# Patient Record
Sex: Male | Born: 1975 | Race: White | Hispanic: No | Marital: Married | Smoking: Never smoker
Health system: Southern US, Community
[De-identification: ages and names within clinical notes are randomized; demographics above are authoritative.]

## PROBLEM LIST (undated history)

## (undated) DIAGNOSIS — I10 Essential (primary) hypertension: Secondary | ICD-10-CM

## (undated) DIAGNOSIS — E785 Hyperlipidemia, unspecified: Secondary | ICD-10-CM

## (undated) HISTORY — PX: KNEE SURGERY: SHX244

---

## 2015-09-21 DIAGNOSIS — I10 Essential (primary) hypertension: Secondary | ICD-10-CM | POA: Diagnosis not present

## 2015-12-17 DIAGNOSIS — Z23 Encounter for immunization: Secondary | ICD-10-CM | POA: Diagnosis not present

## 2016-06-29 DIAGNOSIS — Z6839 Body mass index (BMI) 39.0-39.9, adult: Secondary | ICD-10-CM | POA: Diagnosis not present

## 2016-06-29 DIAGNOSIS — S96211S Strain of intrinsic muscle and tendon at ankle and foot level, right foot, sequela: Secondary | ICD-10-CM | POA: Diagnosis not present

## 2016-06-29 DIAGNOSIS — G4730 Sleep apnea, unspecified: Secondary | ICD-10-CM | POA: Diagnosis not present

## 2016-06-29 DIAGNOSIS — M2352 Chronic instability of knee, left knee: Secondary | ICD-10-CM | POA: Diagnosis not present

## 2016-06-29 DIAGNOSIS — Z111 Encounter for screening for respiratory tuberculosis: Secondary | ICD-10-CM | POA: Diagnosis not present

## 2016-06-29 DIAGNOSIS — Z23 Encounter for immunization: Secondary | ICD-10-CM | POA: Diagnosis not present

## 2016-09-26 DIAGNOSIS — Z0000 Encounter for general adult medical examination without abnormal findings: Secondary | ICD-10-CM | POA: Diagnosis not present

## 2016-09-26 DIAGNOSIS — Z125 Encounter for screening for malignant neoplasm of prostate: Secondary | ICD-10-CM | POA: Diagnosis not present

## 2016-09-26 DIAGNOSIS — Z13220 Encounter for screening for lipoid disorders: Secondary | ICD-10-CM | POA: Diagnosis not present

## 2016-10-03 DIAGNOSIS — Z0000 Encounter for general adult medical examination without abnormal findings: Secondary | ICD-10-CM | POA: Diagnosis not present

## 2016-10-03 DIAGNOSIS — Z6839 Body mass index (BMI) 39.0-39.9, adult: Secondary | ICD-10-CM | POA: Diagnosis not present

## 2016-10-03 DIAGNOSIS — G4730 Sleep apnea, unspecified: Secondary | ICD-10-CM | POA: Diagnosis not present

## 2016-10-03 DIAGNOSIS — J309 Allergic rhinitis, unspecified: Secondary | ICD-10-CM | POA: Diagnosis not present

## 2016-12-05 DIAGNOSIS — Z23 Encounter for immunization: Secondary | ICD-10-CM | POA: Diagnosis not present

## 2016-12-26 DIAGNOSIS — Z13220 Encounter for screening for lipoid disorders: Secondary | ICD-10-CM | POA: Diagnosis not present

## 2016-12-26 DIAGNOSIS — R945 Abnormal results of liver function studies: Secondary | ICD-10-CM | POA: Diagnosis not present

## 2017-01-09 DIAGNOSIS — Z6828 Body mass index (BMI) 28.0-28.9, adult: Secondary | ICD-10-CM | POA: Diagnosis not present

## 2017-01-09 DIAGNOSIS — R030 Elevated blood-pressure reading, without diagnosis of hypertension: Secondary | ICD-10-CM | POA: Diagnosis not present

## 2017-01-09 DIAGNOSIS — G4730 Sleep apnea, unspecified: Secondary | ICD-10-CM | POA: Diagnosis not present

## 2017-01-09 DIAGNOSIS — E785 Hyperlipidemia, unspecified: Secondary | ICD-10-CM | POA: Diagnosis not present

## 2017-09-15 ENCOUNTER — Ambulatory Visit
Admission: RE | Admit: 2017-09-15 | Discharge: 2017-09-15 | Disposition: A | Discharge status: Home / Self Care | Payer: BLUE CROSS/BLUE SHIELD | Source: Ambulatory Visit | Attending: Family Medicine | Admitting: Family Medicine

## 2017-09-15 ENCOUNTER — Other Ambulatory Visit: Payer: Self-pay | Admitting: Family Medicine

## 2017-09-15 DIAGNOSIS — W57XXXA Bitten or stung by nonvenomous insect and other nonvenomous arthropods, initial encounter: Secondary | ICD-10-CM | POA: Diagnosis not present

## 2017-09-15 DIAGNOSIS — G8929 Other chronic pain: Secondary | ICD-10-CM

## 2017-09-15 DIAGNOSIS — Z6828 Body mass index (BMI) 28.0-28.9, adult: Secondary | ICD-10-CM | POA: Diagnosis not present

## 2017-09-15 DIAGNOSIS — M542 Cervicalgia: Secondary | ICD-10-CM | POA: Diagnosis not present

## 2017-10-02 DIAGNOSIS — Z6828 Body mass index (BMI) 28.0-28.9, adult: Secondary | ICD-10-CM | POA: Diagnosis not present

## 2017-10-02 DIAGNOSIS — M542 Cervicalgia: Secondary | ICD-10-CM | POA: Diagnosis not present

## 2017-10-02 DIAGNOSIS — J309 Allergic rhinitis, unspecified: Secondary | ICD-10-CM | POA: Diagnosis not present

## 2017-10-02 DIAGNOSIS — G4730 Sleep apnea, unspecified: Secondary | ICD-10-CM | POA: Diagnosis not present

## 2017-10-09 DIAGNOSIS — Z125 Encounter for screening for malignant neoplasm of prostate: Secondary | ICD-10-CM | POA: Diagnosis not present

## 2017-10-09 DIAGNOSIS — Z1329 Encounter for screening for other suspected endocrine disorder: Secondary | ICD-10-CM | POA: Diagnosis not present

## 2017-10-09 DIAGNOSIS — E785 Hyperlipidemia, unspecified: Secondary | ICD-10-CM | POA: Diagnosis not present

## 2017-10-09 DIAGNOSIS — Z114 Encounter for screening for human immunodeficiency virus [HIV]: Secondary | ICD-10-CM | POA: Diagnosis not present

## 2017-10-09 DIAGNOSIS — Z0000 Encounter for general adult medical examination without abnormal findings: Secondary | ICD-10-CM | POA: Diagnosis not present

## 2017-10-20 DIAGNOSIS — R030 Elevated blood-pressure reading, without diagnosis of hypertension: Secondary | ICD-10-CM | POA: Diagnosis not present

## 2017-10-20 DIAGNOSIS — Z6828 Body mass index (BMI) 28.0-28.9, adult: Secondary | ICD-10-CM | POA: Diagnosis not present

## 2017-10-20 DIAGNOSIS — Z0000 Encounter for general adult medical examination without abnormal findings: Secondary | ICD-10-CM | POA: Diagnosis not present

## 2017-10-20 DIAGNOSIS — E781 Pure hyperglyceridemia: Secondary | ICD-10-CM | POA: Diagnosis not present

## 2018-01-23 DIAGNOSIS — E785 Hyperlipidemia, unspecified: Secondary | ICD-10-CM | POA: Diagnosis not present

## 2018-01-23 DIAGNOSIS — Z6828 Body mass index (BMI) 28.0-28.9, adult: Secondary | ICD-10-CM | POA: Diagnosis not present

## 2018-01-23 DIAGNOSIS — I10 Essential (primary) hypertension: Secondary | ICD-10-CM | POA: Diagnosis not present

## 2018-01-23 DIAGNOSIS — E782 Mixed hyperlipidemia: Secondary | ICD-10-CM | POA: Diagnosis not present

## 2018-06-13 DIAGNOSIS — Z23 Encounter for immunization: Secondary | ICD-10-CM | POA: Diagnosis not present

## 2018-10-11 ENCOUNTER — Other Ambulatory Visit: Payer: Self-pay

## 2018-10-11 DIAGNOSIS — R51 Headache: Secondary | ICD-10-CM | POA: Diagnosis not present

## 2018-10-11 DIAGNOSIS — U071 COVID-19: Secondary | ICD-10-CM | POA: Diagnosis not present

## 2018-10-11 DIAGNOSIS — Z20822 Contact with and (suspected) exposure to covid-19: Secondary | ICD-10-CM

## 2018-10-11 DIAGNOSIS — R0981 Nasal congestion: Secondary | ICD-10-CM | POA: Diagnosis not present

## 2018-10-13 LAB — NOVEL CORONAVIRUS, NAA: SARS-CoV-2, NAA: NOT DETECTED

## 2018-10-15 DIAGNOSIS — R0981 Nasal congestion: Secondary | ICD-10-CM | POA: Diagnosis not present

## 2018-10-15 DIAGNOSIS — J309 Allergic rhinitis, unspecified: Secondary | ICD-10-CM | POA: Diagnosis not present

## 2018-10-15 DIAGNOSIS — U071 COVID-19: Secondary | ICD-10-CM | POA: Diagnosis not present

## 2018-10-15 DIAGNOSIS — R51 Headache: Secondary | ICD-10-CM | POA: Diagnosis not present

## 2018-11-12 DIAGNOSIS — J309 Allergic rhinitis, unspecified: Secondary | ICD-10-CM | POA: Diagnosis not present

## 2018-11-12 DIAGNOSIS — I10 Essential (primary) hypertension: Secondary | ICD-10-CM | POA: Diagnosis not present

## 2018-11-12 DIAGNOSIS — G8929 Other chronic pain: Secondary | ICD-10-CM | POA: Diagnosis not present

## 2018-11-12 DIAGNOSIS — M542 Cervicalgia: Secondary | ICD-10-CM | POA: Diagnosis not present

## 2018-11-22 DIAGNOSIS — M5412 Radiculopathy, cervical region: Secondary | ICD-10-CM | POA: Diagnosis not present

## 2018-12-04 DIAGNOSIS — Z23 Encounter for immunization: Secondary | ICD-10-CM | POA: Diagnosis not present

## 2018-12-10 DIAGNOSIS — M542 Cervicalgia: Secondary | ICD-10-CM | POA: Diagnosis not present

## 2018-12-10 DIAGNOSIS — I10 Essential (primary) hypertension: Secondary | ICD-10-CM | POA: Diagnosis not present

## 2018-12-10 DIAGNOSIS — E782 Mixed hyperlipidemia: Secondary | ICD-10-CM | POA: Diagnosis not present

## 2018-12-10 DIAGNOSIS — L309 Dermatitis, unspecified: Secondary | ICD-10-CM | POA: Diagnosis not present

## 2018-12-31 DIAGNOSIS — Z125 Encounter for screening for malignant neoplasm of prostate: Secondary | ICD-10-CM | POA: Diagnosis not present

## 2018-12-31 DIAGNOSIS — R945 Abnormal results of liver function studies: Secondary | ICD-10-CM | POA: Diagnosis not present

## 2018-12-31 DIAGNOSIS — Z0000 Encounter for general adult medical examination without abnormal findings: Secondary | ICD-10-CM | POA: Diagnosis not present

## 2019-01-07 DIAGNOSIS — J309 Allergic rhinitis, unspecified: Secondary | ICD-10-CM | POA: Diagnosis not present

## 2019-01-07 DIAGNOSIS — Z6828 Body mass index (BMI) 28.0-28.9, adult: Secondary | ICD-10-CM | POA: Diagnosis not present

## 2019-01-07 DIAGNOSIS — E782 Mixed hyperlipidemia: Secondary | ICD-10-CM | POA: Diagnosis not present

## 2019-01-07 DIAGNOSIS — I10 Essential (primary) hypertension: Secondary | ICD-10-CM | POA: Diagnosis not present

## 2019-01-20 ENCOUNTER — Other Ambulatory Visit: Payer: Self-pay

## 2019-01-20 ENCOUNTER — Emergency Department (HOSPITAL_COMMUNITY)
Admission: EM | Admit: 2019-01-20 | Discharge: 2019-01-20 | Disposition: A | Discharge status: Home / Self Care | Payer: BC Managed Care – PPO | Attending: Emergency Medicine | Admitting: Emergency Medicine

## 2019-01-20 ENCOUNTER — Emergency Department (HOSPITAL_COMMUNITY): Payer: BC Managed Care – PPO

## 2019-01-20 DIAGNOSIS — R509 Fever, unspecified: Secondary | ICD-10-CM | POA: Diagnosis not present

## 2019-01-20 DIAGNOSIS — Z20822 Contact with and (suspected) exposure to covid-19: Secondary | ICD-10-CM

## 2019-01-20 DIAGNOSIS — U071 COVID-19: Secondary | ICD-10-CM | POA: Diagnosis not present

## 2019-01-20 DIAGNOSIS — R05 Cough: Secondary | ICD-10-CM | POA: Insufficient documentation

## 2019-01-20 DIAGNOSIS — R0602 Shortness of breath: Secondary | ICD-10-CM | POA: Diagnosis not present

## 2019-01-20 MED ORDER — DEXAMETHASONE SODIUM PHOSPHATE 10 MG/ML IJ SOLN
10.0000 mg | Freq: Once | INTRAMUSCULAR | Status: AC
  Administered 2019-01-20: 10 mg via INTRAMUSCULAR
  Filled 2019-01-20: qty 1

## 2019-01-20 MED ORDER — IBUPROFEN 800 MG PO TABS
800.0000 mg | ORAL_TABLET | Freq: Once | ORAL | Status: AC
  Administered 2019-01-20: 800 mg via ORAL
  Filled 2019-01-20: qty 1

## 2019-01-20 MED ORDER — IBUPROFEN 600 MG PO TABS: 600.0000 mg | ORAL_TABLET | Freq: Four times a day (QID) | ORAL | 0 refills | Status: DC | PRN

## 2019-01-20 MED ORDER — ACETAMINOPHEN 500 MG PO TABS
1000.0000 mg | ORAL_TABLET | Freq: Once | ORAL | Status: AC
  Administered 2019-01-20: 1000 mg via ORAL
  Filled 2019-01-20: qty 2

## 2019-01-20 MED ORDER — PREDNISONE 10 MG (21) PO TBPK: ORAL_TABLET | ORAL | 0 refills | Status: DC

## 2019-01-20 NOTE — ED Triage Notes (Signed)
Pt c/o SOB, cough, and headache that started yesterday. Pt c/o fever that started today. Pt 96% RA at this time.

## 2019-01-20 NOTE — ED Provider Notes (Signed)
[name]-EMERGENCY DEPT Provider Note   CSN: [id number] Arrival date & time: 01/20/19  2038     History   Chief Complaint Chief Complaint  Patient presents with  . Shortness of Breath  . Cough  . Fever  . Headache    HPI Robert Davis is a 43 y.o. male.     Pt presents to the ED today with sob, cough, and fever.  The pt said sx started yesterday.  Pt just attended an orthopedic equipment convention in [place].  He said the convention was in a hotel and sessions were held in small groups.  He said he was wearing a mask during the convention.  No known Covid exposures.  The pt has not taken any tylenol or ibuprofen for his fever.  He had an inhaler which he tried and did not help.       No past medical history on file.  There are no active problems to display for this patient.  HTN High cholesterol    Home Medications    Prior to Admission medications   Medication Sig Start Date End Date Taking? Authorizing Provider  ibuprofen (ADVIL) 600 MG tablet Take 1 tablet (600 mg total) by mouth every 6 (six) hours as needed. 01/20/19   Jacalyn Lefevre, MD  predniSONE (STERAPRED UNI-PAK 21 TAB) 10 MG (21) TBPK tablet Take 6 tabs for 2 days, then 5 for 2 days, then 4 for 2 days, then 3 for 2 days, 2 for 2 days, then 1 for 2 days 01/20/19   Jacalyn Lefevre, MD   Losartan   Family History No family history on file.  Social History Social History   Tobacco Use  . Smoking status: Not on file  Substance Use Topics  . Alcohol use: Not on file  . Drug use: Not on file   No tob  Allergies   Codeine   Review of Systems Review of Systems  Constitutional: Positive for fatigue and fever.  Respiratory: Positive for cough and shortness of breath.   Musculoskeletal: Positive for myalgias.  All other systems reviewed and are negative.    Physical Exam Updated Vital Signs BP (!) 149/83   Pulse (!) 103   Temp (!) 100.9 F (38.3 C) (Oral)   Resp (!) 26    Ht 5\' 11"  (1.803 m)   Wt 95.3 kg   SpO2 95%   BMI 29.29 kg/m   Physical Exam Vitals signs and nursing note reviewed.  Constitutional:      Appearance: He is well-developed.  HENT:     Head: Normocephalic and atraumatic.     Mouth/Throat:     Mouth: Mucous membranes are moist.     Pharynx: Oropharynx is clear.  Eyes:     Extraocular Movements: Extraocular movements intact.     Pupils: Pupils are equal, round, and reactive to light.  Neck:     Musculoskeletal: Normal range of motion and neck supple.  Cardiovascular:     Rate and Rhythm: Regular rhythm. Tachycardia present.  Pulmonary:     Effort: Pulmonary effort is normal.     Breath sounds: Normal breath sounds.  Abdominal:     General: Bowel sounds are normal.     Palpations: Abdomen is soft.  Musculoskeletal: Normal range of motion.  Skin:    General: Skin is warm.     Capillary Refill: Capillary refill takes less than 2 seconds.  Neurological:     General: No focal deficit present.  Mental Status: He is alert and oriented to person, place, and time.  Psychiatric:        Mood and Affect: Mood normal.        Behavior: Behavior normal.      ED Treatments / Results  Labs (all labs ordered are listed, but only abnormal results are displayed) Labs Reviewed  SARS CORONAVIRUS 2 (TAT 6-24 HRS)    EKG None  Radiology Dg Chest Portable 1 View  Result Date: 01/20/2019 CLINICAL DATA:  Shortness of breath and fever EXAM: PORTABLE CHEST 1 VIEW COMPARISON:  None. FINDINGS: The heart size and mediastinal contours are within normal limits. Both lungs are clear. The visualized skeletal structures are unremarkable. IMPRESSION: No active disease. Electronically Signed   By: Ulyses Jarred M.D.   On: 01/20/2019 22:42    Procedures Procedures (including critical care time)  Medications Ordered in ED Medications  acetaminophen (TYLENOL) tablet 1,000 mg (1,000 mg Oral Given 01/20/19 2221)  dexamethasone (DECADRON)  injection 10 mg (10 mg Intramuscular Given 01/20/19 2222)  ibuprofen (ADVIL) tablet 800 mg (800 mg Oral Given 01/20/19 2221)     Initial Impression / Assessment and Plan / ED Course  I have reviewed the triage vital signs and the nursing notes.  Pertinent labs & imaging results that were available during my care of the patient were reviewed by me and considered in my medical decision making (see chart for details).        Pt has no pna on CXR.  He is oxygenating well.  His fever was treated with tylenol and ibuprofen.  I suspect pt has covid.  His swab is pending at d/c.  He knows to stay isolated.  Robert Davis was evaluated in Emergency Department on 01/20/2019 for the symptoms described in the history of present illness. He was evaluated in the context of the global COVID-19 pandemic, which necessitated consideration that the patient might be at risk for infection with the SARS-CoV-2 virus that causes COVID-19. Institutional protocols and algorithms that pertain to the evaluation of patients at risk for COVID-19 are in a state of rapid change based on information released by regulatory bodies including the CDC and federal and state organizations. These policies and algorithms were followed during the patient's care in the ED.  Final Clinical Impressions(s) / ED Diagnoses   Final diagnoses:  Suspected COVID-19 virus infection    ED Discharge Orders         Ordered    predniSONE (STERAPRED UNI-PAK 21 TAB) 10 MG (21) TBPK tablet     01/20/19 2310    ibuprofen (ADVIL) 600 MG tablet  Every 6 hours PRN     01/20/19 2310           Isla Pence, MD 01/20/19 2311

## 2019-01-20 NOTE — ED Notes (Addendum)
Pt ambulatory to room without concern

## 2019-01-21 LAB — SARS CORONAVIRUS 2 (TAT 6-24 HRS): SARS Coronavirus 2: POSITIVE — AB

## 2019-01-22 DIAGNOSIS — R509 Fever, unspecified: Secondary | ICD-10-CM | POA: Diagnosis not present

## 2019-01-22 DIAGNOSIS — U071 COVID-19: Secondary | ICD-10-CM | POA: Diagnosis not present

## 2019-01-22 DIAGNOSIS — R05 Cough: Secondary | ICD-10-CM | POA: Diagnosis not present

## 2019-01-29 DIAGNOSIS — U071 COVID-19: Secondary | ICD-10-CM | POA: Diagnosis not present

## 2019-01-29 DIAGNOSIS — J329 Chronic sinusitis, unspecified: Secondary | ICD-10-CM | POA: Diagnosis not present

## 2019-01-29 DIAGNOSIS — R432 Parageusia: Secondary | ICD-10-CM | POA: Diagnosis not present

## 2019-01-30 ENCOUNTER — Other Ambulatory Visit: Payer: Self-pay

## 2019-01-30 ENCOUNTER — Encounter (HOSPITAL_COMMUNITY): Payer: Self-pay | Admitting: Internal Medicine

## 2019-01-30 ENCOUNTER — Emergency Department (HOSPITAL_COMMUNITY): Payer: BC Managed Care – PPO

## 2019-01-30 ENCOUNTER — Inpatient Hospital Stay (HOSPITAL_COMMUNITY)
Admission: EM | Admit: 2019-01-30 | Discharge: 2019-02-05 | DRG: 871 | Disposition: A | Discharge status: Home / Self Care | Payer: BC Managed Care – PPO | Attending: Family Medicine | Admitting: Family Medicine

## 2019-01-30 DIAGNOSIS — R112 Nausea with vomiting, unspecified: Secondary | ICD-10-CM | POA: Diagnosis not present

## 2019-01-30 DIAGNOSIS — J9600 Acute respiratory failure, unspecified whether with hypoxia or hypercapnia: Secondary | ICD-10-CM | POA: Diagnosis not present

## 2019-01-30 DIAGNOSIS — Z833 Family history of diabetes mellitus: Secondary | ICD-10-CM

## 2019-01-30 DIAGNOSIS — E785 Hyperlipidemia, unspecified: Secondary | ICD-10-CM | POA: Diagnosis not present

## 2019-01-30 DIAGNOSIS — Z885 Allergy status to narcotic agent status: Secondary | ICD-10-CM

## 2019-01-30 DIAGNOSIS — E860 Dehydration: Secondary | ICD-10-CM | POA: Diagnosis present

## 2019-01-30 DIAGNOSIS — E861 Hypovolemia: Secondary | ICD-10-CM | POA: Diagnosis present

## 2019-01-30 DIAGNOSIS — E7800 Pure hypercholesterolemia, unspecified: Secondary | ICD-10-CM | POA: Diagnosis not present

## 2019-01-30 DIAGNOSIS — Z8249 Family history of ischemic heart disease and other diseases of the circulatory system: Secondary | ICD-10-CM

## 2019-01-30 DIAGNOSIS — A4189 Other specified sepsis: Secondary | ICD-10-CM | POA: Diagnosis not present

## 2019-01-30 DIAGNOSIS — U071 COVID-19: Secondary | ICD-10-CM | POA: Diagnosis not present

## 2019-01-30 DIAGNOSIS — Z79899 Other long term (current) drug therapy: Secondary | ICD-10-CM | POA: Diagnosis not present

## 2019-01-30 DIAGNOSIS — R05 Cough: Secondary | ICD-10-CM | POA: Diagnosis not present

## 2019-01-30 DIAGNOSIS — Z8349 Family history of other endocrine, nutritional and metabolic diseases: Secondary | ICD-10-CM | POA: Diagnosis not present

## 2019-01-30 DIAGNOSIS — E871 Hypo-osmolality and hyponatremia: Secondary | ICD-10-CM | POA: Diagnosis present

## 2019-01-30 DIAGNOSIS — R197 Diarrhea, unspecified: Secondary | ICD-10-CM | POA: Diagnosis not present

## 2019-01-30 DIAGNOSIS — R7989 Other specified abnormal findings of blood chemistry: Secondary | ICD-10-CM | POA: Diagnosis not present

## 2019-01-30 DIAGNOSIS — I10 Essential (primary) hypertension: Secondary | ICD-10-CM | POA: Diagnosis present

## 2019-01-30 DIAGNOSIS — R509 Fever, unspecified: Secondary | ICD-10-CM | POA: Diagnosis not present

## 2019-01-30 DIAGNOSIS — J1289 Other viral pneumonia: Secondary | ICD-10-CM | POA: Diagnosis not present

## 2019-01-30 DIAGNOSIS — J069 Acute upper respiratory infection, unspecified: Secondary | ICD-10-CM | POA: Diagnosis not present

## 2019-01-30 DIAGNOSIS — J9601 Acute respiratory failure with hypoxia: Secondary | ICD-10-CM | POA: Diagnosis present

## 2019-01-30 DIAGNOSIS — A0839 Other viral enteritis: Secondary | ICD-10-CM | POA: Diagnosis present

## 2019-01-30 DIAGNOSIS — A084 Viral intestinal infection, unspecified: Secondary | ICD-10-CM

## 2019-01-30 DIAGNOSIS — J1282 Pneumonia due to coronavirus disease 2019: Secondary | ICD-10-CM | POA: Diagnosis present

## 2019-01-30 HISTORY — DX: Essential (primary) hypertension: I10

## 2019-01-30 HISTORY — DX: Hyperlipidemia, unspecified: E78.5

## 2019-01-30 LAB — PROCALCITONIN: Procalcitonin: <0.1 ng/mL

## 2019-01-30 LAB — COMPREHENSIVE METABOLIC PANEL
ALT: 39 U/L (ref 0–44)
AST: 39 U/L (ref 15–41)
Albumin: 3.4 g/dL — ABNORMAL LOW (ref 3.5–5.0)
Alkaline Phosphatase: 39 U/L (ref 38–126)
Anion gap: 11 (ref 5–15)
BUN: 15 mg/dL (ref 6–20)
CO2: 23 mmol/L (ref 22–32)
Calcium: 8.2 mg/dL — ABNORMAL LOW (ref 8.9–10.3)
Chloride: 97 mmol/L — ABNORMAL LOW (ref 98–111)
Creatinine, Ser: 0.98 mg/dL (ref 0.61–1.24)
GFR calc Af Amer: >60 mL/min (ref >60)
GFR calc non Af Amer: >60 mL/min (ref >60)
Glucose, Bld: 107 mg/dL — ABNORMAL HIGH (ref 70–99)
Potassium: 3.9 mmol/L (ref 3.5–5.1)
Sodium: 131 mmol/L — ABNORMAL LOW (ref 135–145)
Total Bilirubin: 0.9 mg/dL (ref 0.3–1.2)
Total Protein: 7 g/dL (ref 6.5–8.1)

## 2019-01-30 LAB — LACTIC ACID, PLASMA: Lactic Acid, Venous: 1 mmol/L (ref 0.5–1.9)

## 2019-01-30 LAB — CBC WITH DIFFERENTIAL/PLATELET
Abs Immature Granulocytes: 0.08 10*3/uL — ABNORMAL HIGH (ref 0.00–0.07)
Basophils Absolute: 0 10*3/uL (ref 0.0–0.1)
Basophils Relative: 0 %
Eosinophils Absolute: 0 10*3/uL (ref 0.0–0.5)
Eosinophils Relative: 0 %
HCT: 42.8 % (ref 39.0–52.0)
Hemoglobin: 14.6 g/dL (ref 13.0–17.0)
Immature Granulocytes: 1 %
Lymphocytes Relative: 8 %
Lymphs Abs: 0.7 10*3/uL (ref 0.7–4.0)
MCH: 30.9 pg (ref 26.0–34.0)
MCHC: 34.1 g/dL (ref 30.0–36.0)
MCV: 90.5 fL (ref 80.0–100.0)
Monocytes Absolute: 0.5 10*3/uL (ref 0.1–1.0)
Monocytes Relative: 6 %
Neutro Abs: 7.6 10*3/uL (ref 1.7–7.7)
Neutrophils Relative %: 85 %
Platelets: 156 10*3/uL (ref 150–400)
RBC: 4.73 MIL/uL (ref 4.22–5.81)
RDW: 12.9 % (ref 11.5–15.5)
WBC: 8.9 10*3/uL (ref 4.0–10.5)
nRBC: 0 % (ref 0.0–0.2)

## 2019-01-30 LAB — FERRITIN: Ferritin: 1389 ng/mL — ABNORMAL HIGH (ref 24–336)

## 2019-01-30 LAB — LACTATE DEHYDROGENASE
LDH: 229 U/L — ABNORMAL HIGH (ref 98–192)
LDH: 236 U/L — ABNORMAL HIGH (ref 98–192)

## 2019-01-30 LAB — C-REACTIVE PROTEIN: CRP: 9 mg/dL — ABNORMAL HIGH (ref <1.0)

## 2019-01-30 LAB — D-DIMER, QUANTITATIVE: D-Dimer, Quant: 0.86 ug/mL-FEU — ABNORMAL HIGH (ref 0.00–0.50)

## 2019-01-30 LAB — FIBRINOGEN: Fibrinogen: 639 mg/dL — ABNORMAL HIGH (ref 210–475)

## 2019-01-30 LAB — TRIGLYCERIDES: Triglycerides: 88 mg/dL (ref <150)

## 2019-01-30 MED ORDER — SODIUM CHLORIDE 0.9 % IV BOLUS
500.0000 mL | Freq: Once | INTRAVENOUS | Status: AC
  Administered 2019-01-30: 500 mL via INTRAVENOUS

## 2019-01-30 MED ORDER — ENOXAPARIN SODIUM 40 MG/0.4ML ~~LOC~~ SOLN
40.0000 mg | SUBCUTANEOUS | Status: DC
  Administered 2019-01-30 – 2019-02-04 (×6): 40 mg via SUBCUTANEOUS
  Filled 2019-01-30 (×6): qty 0.4

## 2019-01-30 MED ORDER — ONDANSETRON HCL 4 MG/2ML IJ SOLN
4.0000 mg | Freq: Once | INTRAMUSCULAR | Status: AC
  Administered 2019-01-30: 4 mg via INTRAVENOUS
  Filled 2019-01-30: qty 2

## 2019-01-30 MED ORDER — SODIUM CHLORIDE 0.9 % IV SOLN
100.0000 mg | INTRAVENOUS | Status: AC
  Administered 2019-01-31 – 2019-02-03 (×4): 100 mg via INTRAVENOUS
  Filled 2019-01-30 (×4): qty 20

## 2019-01-30 MED ORDER — ONDANSETRON HCL 4 MG/2ML IJ SOLN: 4.0000 mg | Freq: Four times a day (QID) | INTRAMUSCULAR | Status: DC | PRN

## 2019-01-30 MED ORDER — ROSUVASTATIN CALCIUM 5 MG PO TABS
5.0000 mg | ORAL_TABLET | Freq: Every day | ORAL | Status: DC
  Administered 2019-01-30 – 2019-02-05 (×7): 5 mg via ORAL
  Filled 2019-01-30 (×7): qty 1

## 2019-01-30 MED ORDER — DEXAMETHASONE SODIUM PHOSPHATE 10 MG/ML IJ SOLN
6.0000 mg | INTRAMUSCULAR | Status: DC
  Administered 2019-01-30 – 2019-02-01 (×3): 6 mg via INTRAVENOUS
  Filled 2019-01-30 (×3): qty 1

## 2019-01-30 MED ORDER — ZINC SULFATE 220 (50 ZN) MG PO CAPS
220.0000 mg | ORAL_CAPSULE | Freq: Every day | ORAL | Status: DC
  Administered 2019-01-30 – 2019-02-05 (×7): 220 mg via ORAL
  Filled 2019-01-30 (×7): qty 1

## 2019-01-30 MED ORDER — GUAIFENESIN-DM 100-10 MG/5ML PO SYRP
10.0000 mL | ORAL_SOLUTION | ORAL | Status: DC | PRN
  Administered 2019-02-02 – 2019-02-03 (×3): 10 mL via ORAL
  Filled 2019-01-30 (×3): qty 10

## 2019-01-30 MED ORDER — ACETAMINOPHEN 325 MG PO TABS
650.0000 mg | ORAL_TABLET | Freq: Once | ORAL | Status: AC
  Administered 2019-01-30: 650 mg via ORAL
  Filled 2019-01-30: qty 2

## 2019-01-30 MED ORDER — VITAMIN C 500 MG PO TABS
500.0000 mg | ORAL_TABLET | Freq: Every day | ORAL | Status: DC
  Administered 2019-01-30 – 2019-02-05 (×7): 500 mg via ORAL
  Filled 2019-01-30 (×7): qty 1

## 2019-01-30 MED ORDER — LORATADINE 10 MG PO TABS
10.0000 mg | ORAL_TABLET | Freq: Every day | ORAL | Status: DC
  Administered 2019-01-30 – 2019-02-05 (×7): 10 mg via ORAL
  Filled 2019-01-30 (×7): qty 1

## 2019-01-30 MED ORDER — ACETAMINOPHEN 325 MG PO TABS: 650.0000 mg | ORAL_TABLET | Freq: Four times a day (QID) | ORAL | Status: DC | PRN

## 2019-01-30 MED ORDER — SODIUM CHLORIDE 0.9 % IV SOLN
200.0000 mg | Freq: Once | INTRAVENOUS | Status: AC
  Administered 2019-01-30: 200 mg via INTRAVENOUS
  Filled 2019-01-30: qty 40

## 2019-01-30 MED ORDER — LOPERAMIDE HCL 2 MG PO CAPS: 2.0000 mg | ORAL_CAPSULE | ORAL | Status: DC | PRN

## 2019-01-30 NOTE — ED Notes (Signed)
Per PCP, states recently diagnosed with Covid-states vomiting, fever and possible dehydration-will probably need admission

## 2019-01-30 NOTE — Progress Notes (Signed)
Pharmacy: Remdesivir   Patient is a 43 y.o. male with COVID.  Pharmacy has been consulted for remdesivir dosing.   - CXR shows "New consolidation in the left mid lung compatible with active pneumonia." -Pt requiring supplemental oxygen (2L [place]) -ALT 39    A/P:  - Patient meets criteria for remdesivir. Will initiate remdesivir 200 mg once followed by 100 mg daily x 4 days.  - Daily CMET while on remdesivir - Will f/u pt's ALT and clinical condition

## 2019-01-30 NOTE — ED Provider Notes (Signed)
[facility] DEPT Provider Note   CSN: [id number] Arrival date & time: 01/30/19  1722     History   Chief Complaint No chief complaint on file.   HPI Robert Davis is a 43 y.o. male with history of known COVID-19 infection presenting today for evaluation of acute onset, progressively worsening fevers, nausea, vomiting, diarrhea since last night.  He was seen and evaluated at [facility] ED on 01/20/2019 after attending an orthopedic equipment convention in [place] with shortness of breath, cough, fever.  Was found to be stable for discharge home at that time but tested positive for Covid.  He states that he has had low-grade fevers at home, has been taking prednisone but was told to discontinue this by his PCP yesterday.  Yesterday he developed a fever of 102 F, took some ibuprofen and shortly thereafter developed nausea and vomiting.  Reports nausea is persistent, has not been able to tolerate p.o. since yesterday.  Multiple episodes of nonbloody nonbilious emesis and watery nonbloody diarrhea.  Notes dyspnea on exertion, cough productive of bloody sputum since yesterday.  Denies chest pain, no leg swelling, no prior history of DVT or PE.     The history is provided by the patient.    No past medical history on file.  Patient Active Problem List   Diagnosis Date Noted  . Acute respiratory failure with hypoxia ([place]) 01/30/2019  . Pneumonia due to COVID-19 virus 01/30/2019      Home Medications    Prior to Admission medications   Medication Sig Start Date End Date Taking? Authorizing Provider  amoxicillin-clavulanate (AUGMENTIN) 875-125 MG tablet Take 1 tablet by mouth 2 (two) times daily. 01/29/19  Yes [provider]  ibuprofen (ADVIL) 600 MG tablet Take 1 tablet (600 mg total) by mouth every 6 (six) hours as needed. 01/20/19  Yes Isla Pence, MD  loratadine (CLARITIN) 10 MG tablet Take 10 mg by mouth daily.   Yes [provider]  losartan (COZAAR) 25 MG tablet Take 25 mg by mouth daily. 12/10/18  Yes [provider]  rosuvastatin (CRESTOR) 5 MG tablet Take 5 mg by mouth daily. 01/07/19  Yes [provider]  predniSONE (STERAPRED UNI-PAK 21 TAB) 10 MG (21) TBPK tablet Take 6 tabs for 2 days, then 5 for 2 days, then 4 for 2 days, then 3 for 2 days, 2 for 2 days, then 1 for 2 days Patient not taking: Reported on 01/30/2019 01/20/19   Isla Pence, MD    Family History No family history on file.  Social History Social History   Tobacco Use  . Smoking status: Not on file  Substance Use Topics  . Alcohol use: Not on file  . Drug use: Not on file     Allergies   Codeine   Review of Systems Review of Systems  Constitutional: Positive for chills, fatigue and fever.  Respiratory: Positive for cough and shortness of breath.   Cardiovascular: Negative for chest pain and leg swelling.  Gastrointestinal: Positive for diarrhea, nausea and vomiting. Negative for abdominal pain.  Musculoskeletal: Positive for myalgias.  All other systems reviewed and are negative.    Physical Exam Updated Vital Signs BP 133/86 (BP Location: Left Arm)   Pulse 89   Temp 100.3 F (37.9 C) (Oral)   Resp 20   Ht 5\' 11"  (1.803 m)   Wt 90.7 kg   SpO2 96%   BMI 27.89 kg/m   Physical Exam Vitals signs and  nursing note reviewed.  Constitutional:      General: He is not in acute distress.    Appearance: He is well-developed.  HENT:     Head: Normocephalic and atraumatic.  Eyes:     General:        Right eye: No discharge.        Left eye: No discharge.     Conjunctiva/sclera: Conjunctivae normal.  Neck:     Musculoskeletal: Normal range of motion and neck supple. No neck rigidity or muscular tenderness.     Vascular: No JVD.     Trachea: No tracheal deviation.  Cardiovascular:     Rate and Rhythm: Tachycardia present.     Pulses: Normal pulses.  Pulmonary:     Comments: Tachypneic, speaking in  full sentences, SPO2 saturations 93% on room air Abdominal:     General: Bowel sounds are normal. There is no distension.     Palpations: Abdomen is soft.     Tenderness: There is no abdominal tenderness. There is no guarding or rebound.  Skin:    General: Skin is warm and dry.     Findings: No erythema.  Neurological:     Mental Status: He is alert.  Psychiatric:        Behavior: Behavior normal.      ED Treatments / Results  Labs (all labs ordered are listed, but only abnormal results are displayed) Labs Reviewed  CBC WITH DIFFERENTIAL/PLATELET - Abnormal; Notable for the following components:      Result Value   Abs Immature Granulocytes 0.08 (*)    All other components within normal limits  COMPREHENSIVE METABOLIC PANEL - Abnormal; Notable for the following components:   Sodium 131 (*)    Chloride 97 (*)    Glucose, Bld 107 (*)    Calcium 8.2 (*)    Albumin 3.4 (*)    All other components within normal limits  D-DIMER, QUANTITATIVE (NOT AT [facility]) - Abnormal; Notable for the following components:   D-Dimer, Quant 0.86 (*)    All other components within normal limits  LACTATE DEHYDROGENASE - Abnormal; Notable for the following components:   LDH 229 (*)    All other components within normal limits  FERRITIN - Abnormal; Notable for the following components:   Ferritin 1,389 (*)    All other components within normal limits  FIBRINOGEN - Abnormal; Notable for the following components:   Fibrinogen 639 (*)    All other components within normal limits  C-REACTIVE PROTEIN - Abnormal; Notable for the following components:   CRP 9.0 (*)    All other components within normal limits  CULTURE, BLOOD (ROUTINE X 2)  CULTURE, BLOOD (ROUTINE X 2)  LACTIC ACID, PLASMA  PROCALCITONIN  TRIGLYCERIDES  HIV ANTIBODY (ROUTINE TESTING W REFLEX)  C-REACTIVE PROTEIN  D-DIMER, QUANTITATIVE (NOT AT [facility])  FERRITIN  FIBRINOGEN  LACTATE DEHYDROGENASE  PROCALCITONIN  CBC WITH  DIFFERENTIAL/PLATELET  COMPREHENSIVE METABOLIC PANEL  C-REACTIVE PROTEIN  D-DIMER, QUANTITATIVE (NOT AT [facility])  FERRITIN  LACTATE DEHYDROGENASE  ABO/RH    EKG None  Radiology Dg Chest Port 1 View  Result Date: 01/30/2019 CLINICAL DATA:  Coughing, nausea, and diarrhea. Patient diagnosed with COVID-19 2 weeks ago. EXAM: PORTABLE CHEST 1 VIEW COMPARISON:  01/20/2019 FINDINGS: New consolidation in the left mid lung compatible with active pneumonia. Hazy density in the right infrahilar and perihilar region could reflect early multilobar involvement. Cardiac and mediastinal margins appear normal. No blunting of the costophrenic angles. IMPRESSION: 1. New  consolidation in the left mid lung compatible with active pneumonia. 2. Hazy density in the right perihilar and infrahilar region could reflect early multilobar pneumonia involvement. Electronically Signed   By: Gaylyn RongWalter  Liebkemann M.D.   On: 01/30/2019 20:18    Procedures Procedures (including critical care time)  Medications Ordered in ED Medications  rosuvastatin (CRESTOR) tablet 5 mg (5 mg Oral Given 01/30/19 2225)  loratadine (CLARITIN) tablet 10 mg (10 mg Oral Given 01/30/19 2311)  enoxaparin (LOVENOX) injection 40 mg (40 mg Subcutaneous Given 01/30/19 2225)  dexamethasone (DECADRON) injection 6 mg (6 mg Intravenous Given 01/30/19 2311)  guaiFENesin-dextromethorphan (ROBITUSSIN DM) 100-10 MG/5ML syrup 10 mL (has no administration in time range)  vitamin C (ASCORBIC ACID) tablet 500 mg (500 mg Oral Given 01/30/19 2311)  zinc sulfate capsule 220 mg (220 mg Oral Given 01/30/19 2312)  acetaminophen (TYLENOL) tablet 650 mg (has no administration in time range)  remdesivir 200 mg in sodium chloride 0.9 % 250 mL IVPB (200 mg Intravenous New Bag/Given 01/30/19 2313)    Followed by  remdesivir 100 mg in sodium chloride 0.9 % 250 mL IVPB (has no administration in time range)  ondansetron (ZOFRAN) injection 4 mg (4 mg Intravenous Given 01/30/19  1835)  sodium chloride 0.9 % bolus 500 mL (0 mLs Intravenous Stopped 01/30/19 2125)  acetaminophen (TYLENOL) tablet 650 mg (650 mg Oral Given 01/30/19 1841)     Initial Impression / Assessment and Plan / ED Course  I have reviewed the triage vital signs and the nursing notes.  Pertinent labs & imaging results that were available during my care of the patient were reviewed by me and considered in my medical decision making (see chart for details).        Robert Davis was evaluated in Emergency Department on 01/30/2019 for the symptoms described in the history of present illness. He was evaluated in the context of the global COVID-19 pandemic, which necessitated consideration that the patient might be at risk for infection with the SARS-CoV-2 virus that causes COVID-19. Institutional protocols and algorithms that pertain to the evaluation of patients at risk for COVID-19 are in a state of rapid change based on information released by regulatory bodies including the CDC and federal and state organizations. These policies and algorithms were followed during the patient's care in the ED.  Patient who tested positive for Covid 10 days ago returns to the ED for evaluation of sudden onset nausea vomiting diarrhea and worsening fever last night.  He is febrile in the ED, tachycardic.  No peritoneal signs or tenderness on examination of the abdomen.  Doubt acute surgical abdominal pathology.  While in the ED developed hypoxia with SPO2 saturations down to 86% on room air with good waveform.  He had improvement of to 96% on 3 L supplemental oxygen via nasal cannula.  Lab work reviewed by me shows no leukocytosis, no anemia, no renal insufficiency, no metabolic derangements.  Covid markers are elevated.  Given he is requiring supplemental oxygen will require admission to the hospital.  Spoke with Dr. Loney Lohathore with [facility] hospitalist service who agrees to assume care of patient and admit for further evaluation and  management.  Final Clinical Impressions(s) / ED Diagnoses   Final diagnoses:  COVID-19 virus infection  Acute respiratory failure with hypoxia (HCC)  Nausea vomiting and diarrhea    ED Discharge Orders    None       Bennye AlmFawze, Naiomi Musto A, PA-C 01/30/19 2333    Milagros Lollykstra, Richard S, MD  01/31/19 0031  

## 2019-01-30 NOTE — ED Triage Notes (Signed)
Pt from home.  Pt tested positive for COVID about 2 weeks ago.  Pt has had increased coughing, coughing up bloody sputum, and has had nausea and diarrhea since.

## 2019-01-30 NOTE — H&P (Signed)
History and Physical    Robert Davis CBJ:[id number] DOB: [birth year] DOA: 01/30/2019  PCP: Fanny Bien, MD Patient coming from: Home  Chief Complaint: Fevers  HPI: Robert Davis is a 43 y.o. male with hypertension, hyperlipidemia, and known history of COVID-19 viral infection presenting to the ED for evaluation of acute onset, progressively worsening fevers, nausea, vomiting, and diarrhea.  Patient was seen in the ED on 11/8 after attending an orthopedic equipment convention in [place] with shortness of breath, cough, and fever.  He tested positive for COVID-19 at that time but was discharged home as he was found to be stable.  Patient states he was discharged from the ED with a steroid.  He initially improved but then last night started having fevers again.  His temperature was 102 F.  He felt nauseous and vomited last night.  He has continued to feel nauseous all day today but has not vomited.  He had an episode of nonbloody diarrhea today.  No abdominal pain.  He has been feeling very weak and lightheaded.  ED Course: Temperature 103.2 F, tachycardic, tachypneic.  Blood pressure stable.  Oxygen saturation 86% on room air, improved with 3 L supplemental oxygen.  No leukocytosis.  Lactic acid normal. Procalcitonin <0.10. Sodium 131.  LFTs normal.  Inflammatory markers elevated: D-dimer 0.86, LDH 229, ferritin 1389, fibrinogen 639, CRP 9.0.  Blood culture x2 pending.  Chest x-ray showing new consolidation in the left midlung compatible with active pneumonia.  Hazy density in the right perihilar and infrahilar region, could reflect early multi lobar pneumonia.  Patient is a Tylenol, Zofran, and a 500 cc normal saline bolus.  Review of Systems:  All systems reviewed and apart from history of presenting illness, are negative.  Past Medical History:  Diagnosis Date  . HLD (hyperlipidemia)   . HTN (hypertension)    Past surgical history: Knee surgery   reports that he has never smoked. He  has never used smokeless tobacco. He reports current alcohol use. He reports that he does not use drugs.  Allergies  Allergen Reactions  . Codeine Nausea Only and Hypertension    Family History  Problem Relation Age of Onset  . Hypercholesterolemia Mother   . High blood pressure Mother   . Diabetes Mother   . Hypercholesterolemia Father   . High blood pressure Father   . Diabetes Father     Prior to Admission medications   Medication Sig Start Date End Date Taking? Authorizing Provider  amoxicillin-clavulanate (AUGMENTIN) 875-125 MG tablet Take 1 tablet by mouth 2 (two) times daily. 01/29/19  Yes [provider]  ibuprofen (ADVIL) 600 MG tablet Take 1 tablet (600 mg total) by mouth every 6 (six) hours as needed. 01/20/19  Yes Isla Pence, MD  loratadine (CLARITIN) 10 MG tablet Take 10 mg by mouth daily.   Yes [provider]  losartan (COZAAR) 25 MG tablet Take 25 mg by mouth daily. 12/10/18  Yes [provider]  rosuvastatin (CRESTOR) 5 MG tablet Take 5 mg by mouth daily. 01/07/19  Yes [provider]  predniSONE (STERAPRED UNI-PAK 21 TAB) 10 MG (21) TBPK tablet Take 6 tabs for 2 days, then 5 for 2 days, then 4 for 2 days, then 3 for 2 days, 2 for 2 days, then 1 for 2 days Patient not taking: Reported on 01/30/2019 01/20/19   Isla Pence, MD    Physical Exam: Vitals:   01/30/19 1919 01/30/19 2117 01/30/19 2246 01/31/19 0506  BP: 130/86  132/83 133/86 128/86  Pulse: (!) 102 91 89 75  Resp: (!) 30 (!) 25 20 20   Temp:   100.3 F (37.9 C) 98.9 F (37.2 C)  TempSrc:   Oral Oral  SpO2: 92% 96% 96% 96%  Weight:      Height:        Physical Exam  Constitutional: He is oriented to person, place, and time. He appears well-developed and well-nourished. No distress.  HENT:  Head: Normocephalic.  Eyes: Right eye exhibits no discharge. Left eye exhibits no discharge.  Neck: Neck supple.  Cardiovascular: Normal rate, regular rhythm and intact  distal pulses.  Pulmonary/Chest: He has no wheezes. He has rales.  Rales appreciated at the left lung base On 3 L supplemental oxygen Speaking clearly in full sentences  Abdominal: Soft. Bowel sounds are normal. He exhibits no distension. There is no abdominal tenderness. There is no guarding.  Musculoskeletal:        General: No edema.  Neurological: He is alert and oriented to person, place, and time.  Skin: Skin is warm and dry. He is not diaphoretic.     Labs on Admission: I have personally reviewed following labs and imaging studies  CBC: Recent Labs  Lab 01/30/19 1805 01/31/19 0441  WBC 8.9 9.2  NEUTROABS 7.6 8.1*  HGB 14.6 14.0  HCT 42.8 42.2  MCV 90.5 91.9  PLT 156 161   Basic Metabolic Panel: Recent Labs  Lab 01/30/19 1805  NA 131*  K 3.9  CL 97*  CO2 23  GLUCOSE 107*  BUN 15  CREATININE 0.98  CALCIUM 8.2*   GFR: Estimated Creatinine Clearance: 110.9 mL/min (by C-G formula based on SCr of 0.98 mg/dL). Liver Function Tests: Recent Labs  Lab 01/30/19 1805  AST 39  ALT 39  ALKPHOS 39  BILITOT 0.9  PROT 7.0  ALBUMIN 3.4*   No results for input(s): LIPASE, AMYLASE in the last 168 hours. No results for input(s): AMMONIA in the last 168 hours. Coagulation Profile: No results for input(s): INR, PROTIME in the last 168 hours. Cardiac Enzymes: No results for input(s): CKTOTAL, CKMB, CKMBINDEX, TROPONINI in the last 168 hours. BNP (last 3 results) No results for input(s): PROBNP in the last 8760 hours. HbA1C: No results for input(s): HGBA1C in the last 72 hours. CBG: No results for input(s): GLUCAP in the last 168 hours. Lipid Profile: Recent Labs    01/30/19 1805  TRIG 88   Thyroid Function Tests: No results for input(s): TSH, T4TOTAL, FREET4, T3FREE, THYROIDAB in the last 72 hours. Anemia Panel: Recent Labs    01/30/19 1805 01/30/19 2307  FERRITIN 1,389* 1,847*   Urine analysis: No results found for: COLORURINE, APPEARANCEUR, LABSPEC,  PHURINE, GLUCOSEU, HGBUR, BILIRUBINUR, KETONESUR, PROTEINUR, UROBILINOGEN, NITRITE, LEUKOCYTESUR  Radiological Exams on Admission: Dg Chest Port 1 View  Result Date: 01/30/2019 CLINICAL DATA:  Coughing, nausea, and diarrhea. Patient diagnosed with COVID-19 2 weeks ago. EXAM: PORTABLE CHEST 1 VIEW COMPARISON:  01/20/2019 FINDINGS: New consolidation in the left mid lung compatible with active pneumonia. Hazy density in the right infrahilar and perihilar region could reflect early multilobar involvement. Cardiac and mediastinal margins appear normal. No blunting of the costophrenic angles. IMPRESSION: 1. New consolidation in the left mid lung compatible with active pneumonia. 2. Hazy density in the right perihilar and infrahilar region could reflect early multilobar pneumonia involvement. Electronically Signed   By: Gaylyn RongWalter  Liebkemann M.D.   On: 01/30/2019 20:18    EKG: Independently reviewed.  Sinus tachycardia, heart  rate 105.  Q waves in inferior and anterior leads.  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  No prior EKG for comparison.  Assessment/Plan Principal Problem:   Pneumonia due to COVID-19 virus Active Problems:   Acute respiratory failure with hypoxia (HCC)   Viral gastroenteritis   Hyponatremia   HLD (hyperlipidemia)   Sepsis and acute hypoxic respiratory failure secondary to COVID-19 viral pneumonia Patient tested positive for COVID-19 on 11/8.  Temperature 103.2 F, tachycardic, tachypneic.  Oxygen saturation 86% on room air, improved with 3 L supplemental oxygen.  No leukocytosis.  Lactic acid normal. Procalcitonin <0.10.  Inflammatory markers elevated: D-dimer 0.86, LDH 229, ferritin 1389, fibrinogen 639, CRP 9.0.  Chest x-ray showing new consolidation in the left midlung compatible with active pneumonia.  Hazy density in the right perihilar and infrahilar region, could reflect early multi lobar pneumonia.  -Blood pressure stable. Gentle IV fluid hydration at this time as patient appears  dehydrated.  Goal is to maintain euvolemia to net negative fluid balance.  Avoid volume overload. -IV Decadron 6 mg daily -Remdesivir dosing per pharmacy -Antitussive as needed -Tylenol as needed -Vitamin C and zinc -Daily CBC with differential, CMP, D-dimer, ferritin, LDH, CRP -Airborne and contact precautions -Continuous pulse ox -Supplemental oxygen to keep oxygen saturation above 92% -Self proning for 2 to 3 hours every 12 hours -Blood culture x2 pending  Viral gastroenteritis Patient has complaints of vomiting and diarrhea.  No abdominal pain.  Abdominal exam benign.  LFTs normal. -Symptomatic management: Zofran as needed for nausea/vomiting, loperamide as needed -Gentle IV fluid hydration  Mild hypovolemic hyponatremia Sodium 131.   -Gentle IV fluid hydration -Continue to monitor BMP  Hyperlipidemia -Continue Crestor  HIV screening The patient falls between the ages of 13-64 and should be screened for HIV, therefore HIV testing ordered.  DVT prophylaxis: Lovenox Code Status: Full code Family Communication: No family available. Disposition Plan: Anticipate discharge after clinical improvement. Consults called: None Admission status: It is my clinical opinion that admission to INPATIENT is reasonable and necessary in this 43 y.o. male . presenting with sepsis and acute hypoxic respiratory failure secondary to COVID-19 viral pneumonia.  High risk of decompensation.  Given the aforementioned, the predictability of an adverse outcome is felt to be significant. I expect that the patient will require at least 2 midnights in the hospital to treat this condition.   The medical decision making on this patient was of high complexity and the patient is at high risk for clinical deterioration, therefore this is a level 3 visit.  John Giovanni MD [facility] Pager [phone]  If 7PM-7AM, please contact night-coverage [email or web address] Password [email or web address]  01/31/2019,  5:57 AM

## 2019-01-30 NOTE — Plan of Care (Signed)
Initiated care plan 

## 2019-01-31 DIAGNOSIS — E7800 Pure hypercholesterolemia, unspecified: Secondary | ICD-10-CM

## 2019-01-31 DIAGNOSIS — A084 Viral intestinal infection, unspecified: Secondary | ICD-10-CM

## 2019-01-31 DIAGNOSIS — J9601 Acute respiratory failure with hypoxia: Secondary | ICD-10-CM

## 2019-01-31 DIAGNOSIS — E871 Hypo-osmolality and hyponatremia: Secondary | ICD-10-CM

## 2019-01-31 DIAGNOSIS — E785 Hyperlipidemia, unspecified: Secondary | ICD-10-CM

## 2019-01-31 LAB — FIBRINOGEN: Fibrinogen: 614 mg/dL — ABNORMAL HIGH (ref 210–475)

## 2019-01-31 LAB — COMPREHENSIVE METABOLIC PANEL
ALT: 36 U/L (ref 0–44)
AST: 35 U/L (ref 15–41)
Albumin: 3.2 g/dL — ABNORMAL LOW (ref 3.5–5.0)
Alkaline Phosphatase: 43 U/L (ref 38–126)
Anion gap: 10 (ref 5–15)
BUN: 15 mg/dL (ref 6–20)
CO2: 23 mmol/L (ref 22–32)
Calcium: 8.1 mg/dL — ABNORMAL LOW (ref 8.9–10.3)
Chloride: 99 mmol/L (ref 98–111)
Creatinine, Ser: 0.83 mg/dL (ref 0.61–1.24)
GFR calc Af Amer: >60 mL/min (ref >60)
GFR calc non Af Amer: >60 mL/min (ref >60)
Glucose, Bld: 150 mg/dL — ABNORMAL HIGH (ref 70–99)
Potassium: 4.3 mmol/L (ref 3.5–5.1)
Sodium: 132 mmol/L — ABNORMAL LOW (ref 135–145)
Total Bilirubin: 0.6 mg/dL (ref 0.3–1.2)
Total Protein: 6.6 g/dL (ref 6.5–8.1)

## 2019-01-31 LAB — CBC WITH DIFFERENTIAL/PLATELET
Abs Immature Granulocytes: 0.07 10*3/uL (ref 0.00–0.07)
Basophils Absolute: 0 10*3/uL (ref 0.0–0.1)
Basophils Relative: 0 %
Eosinophils Absolute: 0 10*3/uL (ref 0.0–0.5)
Eosinophils Relative: 0 %
HCT: 42.2 % (ref 39.0–52.0)
Hemoglobin: 14 g/dL (ref 13.0–17.0)
Immature Granulocytes: 1 %
Lymphocytes Relative: 7 %
Lymphs Abs: 0.7 10*3/uL (ref 0.7–4.0)
MCH: 30.5 pg (ref 26.0–34.0)
MCHC: 33.2 g/dL (ref 30.0–36.0)
MCV: 91.9 fL (ref 80.0–100.0)
Monocytes Absolute: 0.4 10*3/uL (ref 0.1–1.0)
Monocytes Relative: 4 %
Neutro Abs: 8.1 10*3/uL — ABNORMAL HIGH (ref 1.7–7.7)
Neutrophils Relative %: 88 %
Platelets: 161 10*3/uL (ref 150–400)
RBC: 4.59 MIL/uL (ref 4.22–5.81)
RDW: 13.2 % (ref 11.5–15.5)
WBC: 9.2 10*3/uL (ref 4.0–10.5)
nRBC: 0 % (ref 0.0–0.2)

## 2019-01-31 LAB — LACTATE DEHYDROGENASE: LDH: 237 U/L — ABNORMAL HIGH (ref 98–192)

## 2019-01-31 LAB — SARS CORONAVIRUS 2 (TAT 6-24 HRS): SARS Coronavirus 2: POSITIVE — AB

## 2019-01-31 LAB — C-REACTIVE PROTEIN
CRP: 9.3 mg/dL — ABNORMAL HIGH (ref <1.0)
CRP: 10.2 mg/dL — ABNORMAL HIGH (ref <1.0)

## 2019-01-31 LAB — FERRITIN
Ferritin: 1847 ng/mL — ABNORMAL HIGH (ref 24–336)
Ferritin: 1845 ng/mL — ABNORMAL HIGH (ref 24–336)

## 2019-01-31 LAB — ABO/RH: ABO/RH(D): A POS

## 2019-01-31 LAB — D-DIMER, QUANTITATIVE
D-Dimer, Quant: 0.77 ug/mL-FEU — ABNORMAL HIGH (ref 0.00–0.50)
D-Dimer, Quant: 0.62 ug/mL-FEU — ABNORMAL HIGH (ref 0.00–0.50)

## 2019-01-31 LAB — PROCALCITONIN: Procalcitonin: 0.12 ng/mL

## 2019-01-31 NOTE — Progress Notes (Signed)
[facility]                                                                              Patient Demographics  Robert Davis, is a 43 y.o. male, DOB - Dec 27, 1975, WIO:[id number]  Admit date - 01/30/2019   Admitting Physician John Giovanni, MD  Outpatient Primary MD for the patient is Lewis Moccasin, MD  Outpatient specialists:   LOS - 1  days   Medical records reviewed and are as summarized below:    Chief complaint; fevers      Brief summary   Patient is a 43 year old male with history of hypertension, hyperlipidemia, COVID-19 viral infection, presented to ED with acute onset, progressively worsening fevers, nausea vomiting and diarrhea.  Patient was seen in ED on 11/8 after attending an orthopedic equipment convention in [place] with shortness of breath cough and fever.  He tested positive at that time but was discharged home as he was found to be stable.  Patient reported that he was discharged from the ED with a steroid.  He initially improved however a night before admission he started having fevers 102 F, nausea and vomiting.  He also had one episode of nonbloody diarrhea. In ED temp, 103.2 F, tachycardiac, tachypneic, O2 sats 86% on room air, placed on 3 L O2. Inflammatory markers elevated, chest x-ray showed new consolidation in left midlung compatible with active pneumonia hazy density in the right perihilar and infrahilar region could reflect early multilobar pneumonia  Assessment & Plan    Principal Problem:  1. Acute Hypoxic Resp. Failure due to Acute Covid 19 Viral Pneumonitis during the ongoing 2020 Covid 19 Pandemic - POA - Patient hypoxic, sats 86% on room air on admission, currently 95% on 3 L. - Patient started on IV dexamethasone, remdesivir per protocol.  Hold off on Actemra and monitor O2 sats, CRP.  If CRP continues to trend up, no improvement in hypoxia, will give Actemra. - Continue to wean oxygen, ambulatory O2 screening daily as  tolerated - Has + sick contact, likely at the conference. -Continue inhalers, cough suppressants, vitamins, supportive treatment, incentive spirometry - Continue to follow labs as below  Recent Labs  Lab 01/30/19 1805 01/30/19 2307 01/31/19 0441  DDIMER 0.86* 0.77* 0.62*  FERRITIN 1,389* 1,847* 1,845*  CRP 9.0* 9.3* 10.2*  ALT 39  --  36  PROCALCITON <0.10 0.12  --     Active Problems:      Viral gastroenteritis -No abdominal pain, abdominal exam benign, LFTs normal, likely due to #1  Mild hypovolemic hyponatremia -Likely due to poor oral intake, patient was placed on gentle IV fluid hydration, slowly improving    HLD (hyperlipidemia) -Continue Crestor  Code Status: Full CODE STATUS DVT Prophylaxis:  Lovenox  Family Communication: Discussed all imaging results, lab results, explained to the patient    Disposition Plan:Pending clinical status, currently inpatient given acute hypoxic respiratory failure, acute COVID-19 illness.  Once ambulatory without overt symptoms or hypoxia would consider discharge home.  Time Spent in minutes 35 minutes  Procedures:  None  Consultants:   None  Antimicrobials:   Anti-infectives (From admission, onward)  Start     Dose/Rate Route Frequency Ordered Stop   01/31/19 2200  remdesivir 100 mg in sodium chloride 0.9 % 250 mL IVPB     100 mg 500 mL/hr over 30 Minutes Intravenous Every 24 hours 01/30/19 2201 02/04/19 2159   01/30/19 2215  remdesivir 200 mg in sodium chloride 0.9 % 250 mL IVPB     200 mg 500 mL/hr over 30 Minutes Intravenous Once 01/30/19 2201 01/31/19 0100          Medications  Scheduled Meds:  dexamethasone (DECADRON) injection  6 mg Intravenous Q24H   enoxaparin (LOVENOX) injection  40 mg Subcutaneous Q24H   loratadine  10 mg Oral Daily   rosuvastatin  5 mg Oral Daily   vitamin C  500 mg Oral Daily   zinc sulfate  220 mg Oral Daily   Continuous Infusions:  remdesivir 100 mg in NS 250 mL      PRN Meds:.acetaminophen, guaiFENesin-dextromethorphan, loperamide, ondansetron (ZOFRAN) IV      Subjective:   Robert Davis was seen and examined today.  O2 sats 95% on 3 L.  Still feels weak, winded on ambulation.  No active nausea or vomiting.  One episode of diarrhea overnight.  Patient denies dizziness, abdominal pain, new weakness, numbess, tingling. No acute events overnight.    Objective:   Vitals:   01/30/19 2117 01/30/19 2246 01/31/19 0506 01/31/19 1310  BP: 132/83 133/86 128/86 128/77  Pulse: 91 89 75 79  Resp: (!) 25 20 20 20   Temp:  100.3 F (37.9 C) 98.9 F (37.2 C) 98.7 F (37.1 C)  TempSrc:  Oral Oral Oral  SpO2: 96% 96% 96% 95%  Weight:      Height:        Intake/Output Summary (Last 24 hours) at 01/31/2019 1428 Last data filed at 01/31/2019 0900 Gross per 24 hour  Intake 860 ml  Output --  Net 860 ml     Wt Readings from Last 3 Encounters:  01/30/19 90.7 kg  01/20/19 95.3 kg     Exam  General: Alert and oriented x 3, NAD  Eyes:   HEENT:  Atraumatic, normocephalic  Cardiovascular: S1 S2 auscultated, no murmurs, RRR  Respiratory: Decreased breath sound at the bases  Gastrointestinal: Soft, nontender, nondistended, + bowel sounds  Ext: no pedal edema bilaterally  Neuro: No new deficits  Musculoskeletal: No digital cyanosis, clubbing  Skin: No rashes  Psych: Normal affect and demeanor, alert and oriented x3    Data Reviewed:  I have personally reviewed following labs and imaging studies  Micro Results Recent Results (from the past 240 hour(s))  Blood Culture (routine x 2)     Status: None (Preliminary result)   Collection Time: 01/30/19  6:10 PM   Specimen: BLOOD  Result Value Ref Range Status   Specimen Description   Final    BLOOD RIGHT ANTECUBITAL Performed at [facility], [address] [place], [place] [address]    Special Requests   Final    BOTTLES DRAWN AEROBIC AND ANAEROBIC Blood Culture adequate  volume Performed at [facility], [address] [place], [place] [address]    Culture   Final    NO GROWTH < 24 HOURS Performed at [facility], [address] [place], [place] [address]    Report Status PENDING  Incomplete  Blood Culture (routine x 2)     Status: None (Preliminary result)   Collection Time: 01/30/19  6:23 PM  Specimen: BLOOD  Result Value Ref Range Status   Specimen Description   Final    BLOOD LEFT ANTECUBITAL Performed at [facility], [place] [address] [place], [place] [address]    Special Requests   Final    BOTTLES DRAWN AEROBIC AND ANAEROBIC Blood Culture adequate volume Performed at [place] [address], [place], [place] [address]    Culture   Final    NO GROWTH < 24 HOURS Performed at [facility] [address] [place], [place] [address]    Report Status PENDING  Incomplete    Radiology Reports Dg Chest Port 1 View  Result Date: 01/30/2019 CLINICAL DATA:  Coughing, nausea, and diarrhea. Patient diagnosed with COVID-19 2 weeks ago. EXAM: PORTABLE CHEST 1 VIEW COMPARISON:  01/20/2019 FINDINGS: New consolidation in the left mid lung compatible with active pneumonia. Hazy density in the right infrahilar and perihilar region could reflect early multilobar involvement. Cardiac and mediastinal margins appear normal. No blunting of the costophrenic angles. IMPRESSION: 1. New consolidation in the left mid lung compatible with active pneumonia. 2. Hazy density in the right perihilar and infrahilar region could reflect early multilobar pneumonia involvement. Electronically Signed   By: Van Clines M.D.   On: 01/30/2019 20:18   Dg Chest Portable 1 View  Result Date: 01/20/2019 CLINICAL DATA:  Shortness of breath and fever EXAM: PORTABLE CHEST 1 VIEW COMPARISON:  None. FINDINGS: The heart size and mediastinal contours are within normal limits. Both lungs are clear. The visualized skeletal  structures are unremarkable. IMPRESSION: No active disease. Electronically Signed   By: Ulyses Jarred M.D.   On: 01/20/2019 22:42    Lab Data:  CBC: Recent Labs  Lab 01/30/19 1805 01/31/19 0441  WBC 8.9 9.2  NEUTROABS 7.6 8.1*  HGB 14.6 14.0  HCT 42.8 42.2  MCV 90.5 91.9  PLT 156 053   Basic Metabolic Panel: Recent Labs  Lab 01/30/19 1805 01/31/19 0441  NA 131* 132*  K 3.9 4.3  CL 97* 99  CO2 23 23  GLUCOSE 107* 150*  BUN 15 15  CREATININE 0.98 0.83  CALCIUM 8.2* 8.1*   GFR: Estimated Creatinine Clearance: 130.9 mL/min (by C-G formula based on SCr of 0.83 mg/dL). Liver Function Tests: Recent Labs  Lab 01/30/19 1805 01/31/19 0441  AST 39 35  ALT 39 36  ALKPHOS 39 43  BILITOT 0.9 0.6  PROT 7.0 6.6  ALBUMIN 3.4* 3.2*   No results for input(s): LIPASE, AMYLASE in the last 168 hours. No results for input(s): AMMONIA in the last 168 hours. Coagulation Profile: No results for input(s): INR, PROTIME in the last 168 hours. Cardiac Enzymes: No results for input(s): CKTOTAL, CKMB, CKMBINDEX, TROPONINI in the last 168 hours. BNP (last 3 results) No results for input(s): PROBNP in the last 8760 hours. HbA1C: No results for input(s): HGBA1C in the last 72 hours. CBG: No results for input(s): GLUCAP in the last 168 hours. Lipid Profile: Recent Labs    01/30/19 1805  TRIG 88   Thyroid Function Tests: No results for input(s): TSH, T4TOTAL, FREET4, T3FREE, THYROIDAB in the last 72 hours. Anemia Panel: Recent Labs    01/30/19 2307 01/31/19 0441  FERRITIN 1,847* 1,845*   Urine analysis: No results found for: COLORURINE, APPEARANCEUR, LABSPEC, PHURINE, GLUCOSEU, HGBUR, BILIRUBINUR, KETONESUR, PROTEINUR, UROBILINOGEN, NITRITE, LEUKOCYTESUR   Allanah Mcfarland M.D. [facility] 01/31/2019, 2:28 PM   Call night coverage person covering after 7pm

## 2019-02-01 ENCOUNTER — Inpatient Hospital Stay (HOSPITAL_COMMUNITY): Payer: BC Managed Care – PPO

## 2019-02-01 ENCOUNTER — Encounter (HOSPITAL_COMMUNITY): Payer: Self-pay

## 2019-02-01 LAB — CBC WITH DIFFERENTIAL/PLATELET
Abs Immature Granulocytes: 0.08 10*3/uL — ABNORMAL HIGH (ref 0.00–0.07)
Basophils Absolute: 0 10*3/uL (ref 0.0–0.1)
Basophils Relative: 0 %
Eosinophils Absolute: 0 10*3/uL (ref 0.0–0.5)
Eosinophils Relative: 0 %
HCT: 42.4 % (ref 39.0–52.0)
Hemoglobin: 14.3 g/dL (ref 13.0–17.0)
Immature Granulocytes: 1 %
Lymphocytes Relative: 7 %
Lymphs Abs: 0.8 10*3/uL (ref 0.7–4.0)
MCH: 31 pg (ref 26.0–34.0)
MCHC: 33.7 g/dL (ref 30.0–36.0)
MCV: 91.8 fL (ref 80.0–100.0)
Monocytes Absolute: 0.6 10*3/uL (ref 0.1–1.0)
Monocytes Relative: 5 %
Neutro Abs: 9.9 10*3/uL — ABNORMAL HIGH (ref 1.7–7.7)
Neutrophils Relative %: 87 %
Platelets: 176 10*3/uL (ref 150–400)
RBC: 4.62 MIL/uL (ref 4.22–5.81)
RDW: 13 % (ref 11.5–15.5)
WBC: 11.3 10*3/uL — ABNORMAL HIGH (ref 4.0–10.5)
nRBC: 0 % (ref 0.0–0.2)

## 2019-02-01 LAB — FERRITIN: Ferritin: 1749 ng/mL — ABNORMAL HIGH (ref 24–336)

## 2019-02-01 LAB — BRAIN NATRIURETIC PEPTIDE: B Natriuretic Peptide: 23.2 pg/mL (ref 0.0–100.0)

## 2019-02-01 LAB — COMPREHENSIVE METABOLIC PANEL
ALT: 34 U/L (ref 0–44)
AST: 34 U/L (ref 15–41)
Albumin: 3.1 g/dL — ABNORMAL LOW (ref 3.5–5.0)
Alkaline Phosphatase: 34 U/L — ABNORMAL LOW (ref 38–126)
Anion gap: 10 (ref 5–15)
BUN: 19 mg/dL (ref 6–20)
CO2: 22 mmol/L (ref 22–32)
Calcium: 7.9 mg/dL — ABNORMAL LOW (ref 8.9–10.3)
Chloride: 101 mmol/L (ref 98–111)
Creatinine, Ser: 0.78 mg/dL (ref 0.61–1.24)
GFR calc Af Amer: >60 mL/min (ref >60)
GFR calc non Af Amer: >60 mL/min (ref >60)
Glucose, Bld: 165 mg/dL — ABNORMAL HIGH (ref 70–99)
Potassium: 3.9 mmol/L (ref 3.5–5.1)
Sodium: 133 mmol/L — ABNORMAL LOW (ref 135–145)
Total Bilirubin: 0.9 mg/dL (ref 0.3–1.2)
Total Protein: 6.6 g/dL (ref 6.5–8.1)

## 2019-02-01 LAB — LACTATE DEHYDROGENASE: LDH: 246 U/L — ABNORMAL HIGH (ref 98–192)

## 2019-02-01 LAB — HIV ANTIBODY (ROUTINE TESTING W REFLEX): HIV Screen 4th Generation wRfx: NONREACTIVE — AB

## 2019-02-01 LAB — C-REACTIVE PROTEIN: CRP: 6.1 mg/dL — ABNORMAL HIGH (ref <1.0)

## 2019-02-01 LAB — D-DIMER, QUANTITATIVE: D-Dimer, Quant: 0.7 ug/mL-FEU — ABNORMAL HIGH (ref 0.00–0.50)

## 2019-02-01 MED ORDER — BENZONATATE 100 MG PO CAPS
200.0000 mg | ORAL_CAPSULE | Freq: Three times a day (TID) | ORAL | Status: DC
  Administered 2019-02-01 – 2019-02-05 (×13): 200 mg via ORAL
  Filled 2019-02-01 (×13): qty 2

## 2019-02-01 MED ORDER — IOHEXOL 350 MG/ML SOLN
100.0000 mL | Freq: Once | INTRAVENOUS | Status: AC | PRN
  Administered 2019-02-01: 100 mL via INTRAVENOUS

## 2019-02-01 NOTE — Progress Notes (Signed)
[facility]                                                                              Patient Demographics  Robert Davis, is a 43 y.o. male, DOB - 08-01-1975, ZOX:[id number]  Admit date - 01/30/2019   Admitting Physician John Giovanni, MD  Outpatient Primary MD for the patient is Lewis Moccasin, MD  Outpatient specialists:   LOS - 2  days   Medical records reviewed and are as summarized below:    Chief complaint; fevers      Brief summary   Patient is a 43 year old male with history of hypertension, hyperlipidemia, COVID-19 viral infection, presented to ED with acute onset, progressively worsening fevers, nausea vomiting and diarrhea.  Patient was seen in ED on 11/8 after attending an orthopedic equipment convention in [place] with shortness of breath cough and fever.  He tested positive at that time but was discharged home as he was found to be stable.  Patient reported that he was discharged from the ED with a steroid.  He initially improved however a night before admission he started having fevers 102 F, nausea and vomiting.  He also had one episode of nonbloody diarrhea. In ED temp, 103.2 F, tachycardiac, tachypneic, O2 sats 86% on room air, placed on 3 L O2. Inflammatory markers elevated, chest x-ray showed new consolidation in left midlung compatible with active pneumonia hazy density in the right perihilar and infrahilar region could reflect early multilobar pneumonia  Assessment & Plan    Principal Problem:  1. Acute Hypoxic Resp. Failure due to Acute Covid 19 Viral Pneumonitis during the ongoing 2020 Covid 19 Pandemic - POA -At rest feels okay but feels winded on ambulating.  O2 sats 90 to 94% on 3 L.  Check BNP -Patient was febrile, tachycardic, tachypneic, hypoxic at the time of admission with elevated inflammatory markers, explained by COVID-19 viral pneumonitis, sepsis, gastroenteritis.  Blood cultures negative. -Continue IV  dexamethasone, remdesivir per protocol.  CRP trending down, hold off on Actemra.  Await CTA chest.  - D-dimer trending up today, obtain CT angiogram of the chest to rule out PE - Continue to wean oxygen, ambulatory O2 screening daily as tolerated - Has + sick contact, likely at the conference. - Continue inhalers, cough suppressants, vitamins, supportive treatment, incentive spirometry - Continue to follow labs as below  Recent Labs  Lab 01/30/19 1805 01/30/19 2307 01/31/19 0441 02/01/19 0354  DDIMER 0.86* 0.77* 0.62* 0.70*  FERRITIN 1,389* 1,847* 1,845* 1,749*  CRP 9.0* 9.3* 10.2* 6.1*  ALT 39  --  36 34  PROCALCITON <0.10 0.12  --   --     Active Problems:      Viral gastroenteritis: Likely due to COVID-19 -Diarrhea improving, currently no abdominal pain.  Abdominal exam benign.  LFTs are normal.    Mild hypovolemic hyponatremia -Sodium improving, encouraged p.o. diet    HLD (hyperlipidemia) -Continue Crestor  Code Status: Full CODE STATUS DVT Prophylaxis:  Lovenox  Family Communication: Called patient's wife, unable to contact, left a detailed voicemail message   Disposition Plan:Pending clinical status, currently inpatient given acute hypoxic respiratory failure, acute  COVID-19 illness.  Once ambulatory without overt symptoms or hypoxia would consider discharge home.  Time Spent in minutes 25 minutes  Procedures:  None  Consultants:   None  Antimicrobials:   Anti-infectives (From admission, onward)   Start     Dose/Rate Route Frequency Ordered Stop   01/31/19 2200  remdesivir 100 mg in sodium chloride 0.9 % 250 mL IVPB     100 mg 500 mL/hr over 30 Minutes Intravenous Every 24 hours 01/30/19 2201 02/04/19 2159   01/30/19 2215  remdesivir 200 mg in sodium chloride 0.9 % 250 mL IVPB     200 mg 500 mL/hr over 30 Minutes Intravenous Once 01/30/19 2201 01/31/19 0100         Medications  Scheduled Meds:  benzonatate  200 mg Oral TID   dexamethasone  (DECADRON) injection  6 mg Intravenous Q24H   enoxaparin (LOVENOX) injection  40 mg Subcutaneous Q24H   loratadine  10 mg Oral Daily   rosuvastatin  5 mg Oral Daily   vitamin C  500 mg Oral Daily   zinc sulfate  220 mg Oral Daily   Continuous Infusions:  remdesivir 100 mg in NS 250 mL 100 mg (01/31/19 2043)   PRN Meds:.acetaminophen, guaiFENesin-dextromethorphan, loperamide, ondansetron (ZOFRAN) IV      Subjective:   Robert Davis was seen and examined today.  O2 sats 93 to 95% on 3 L.  States at rest feels better however still feels quite winded on ambulation.  Diarrhea improving, no abdominal pain nausea vomiting.  No fevers.  Patient denies dizziness, new weakness, numbess, tingling. No acute events overnight.    Objective:   Vitals:   01/31/19 0506 01/31/19 1310 01/31/19 2106 02/01/19 0541  BP: 128/86 128/77 125/82 122/80  Pulse: 75 79 84 66  Resp: 20 20 18 18   Temp: 98.9 F (37.2 C) 98.7 F (37.1 C) 98.9 F (37.2 C) 98.2 F (36.8 C)  TempSrc: Oral Oral Oral Oral  SpO2: 96% 95% 92% 94%  Weight:      Height:        Intake/Output Summary (Last 24 hours) at 02/01/2019 1406 Last data filed at 02/01/2019 0900 Gross per 24 hour  Intake 840 ml  Output --  Net 840 ml     Wt Readings from Last 3 Encounters:  01/30/19 90.7 kg  01/20/19 95.3 kg   Physical Exam  General: Alert and oriented x 3, NAD  Eyes:   HEENT:  Atraumatic, normocephalic  Cardiovascular: S1 S2 clear, no murmurs, RRR. No pedal edema b/l  Respiratory: Decreased breath sound at the bases  Gastrointestinal: Soft, nontender, nondistended, NBS  Ext: no pedal edema bilaterally  Neuro: no new deficits  Musculoskeletal: No cyanosis, clubbing  Skin: No rashes  Psych: Normal affect and demeanor, alert and oriented x3      Data Reviewed:  I have personally reviewed following labs and imaging studies  Micro Results Recent Results (from the past 240 hour(s))  Blood Culture (routine x  2)     Status: None (Preliminary result)   Collection Time: 01/30/19  6:10 PM   Specimen: BLOOD  Result Value Ref Range Status   Specimen Description   Final    BLOOD RIGHT ANTECUBITAL Performed at [facility], [address] [place], [place] [address]    Special Requests   Final    BOTTLES DRAWN AEROBIC AND ANAEROBIC Blood Culture adequate volume Performed at [facility], [address] [place], [place] [address]  Culture   Final    NO GROWTH 2 DAYS Performed at [facility], [address], [place], [place] [address]    Report Status PENDING  Incomplete  Blood Culture (routine x 2)     Status: None (Preliminary result)   Collection Time: 01/30/19  6:23 PM   Specimen: BLOOD  Result Value Ref Range Status   Specimen Description   Final    BLOOD LEFT ANTECUBITAL Performed at [facility], [address] [place], [place] [address]    Special Requests   Final    BOTTLES DRAWN AEROBIC AND ANAEROBIC Blood Culture adequate volume Performed at [facility], [address] [place], [place] [address]    Culture   Final    NO GROWTH 2 DAYS Performed at [facility], [address], [place], [place] [address]    Report Status PENDING  Incomplete  SARS CORONAVIRUS 2 (TAT 6-24 HRS) Nasopharyngeal Nasopharyngeal Swab     Status: Abnormal   Collection Time: 01/31/19  9:24 AM   Specimen: Nasopharyngeal Swab  Result Value Ref Range Status   SARS Coronavirus 2 POSITIVE (A) NEGATIVE Final    Comment: RESULT CALLED TO, READ BACK BY AND VERIFIED WITH: Freida Busman RN 15:05 01/31/19 ([email or web address]) (NOTE) SARS-CoV-2 target nucleic acids are DETECTED. The SARS-CoV-2 RNA is generally detectable in upper and lower respiratory specimens during the acute phase of infection. Positive results are indicative of active infection with SARS-CoV-2. Clinical  correlation with patient history and other diagnostic information is necessary to  determine patient infection status. Positive results do  not rule out bacterial infection or co-infection with other viruses. The expected result is Negative. Fact Sheet for Patients: HairSlick.no Fact Sheet for Healthcare Providers: quierodirigir.com This test is not yet approved or cleared by the [place] FDA and  has been authorized for detection and/or diagnosis of SARS-CoV-2 by FDA under an Emergency Use Authorization (EUA). This EUA will remain  in effect (meaning this test can be used) f or the duration of the COVID-19 declaration under Section 564(b)(1) of the Act, 21 U.S.C. section 360bbb-3(b)(1), unless the authorization is terminated or revoked sooner. Performed at [facility], [address], [place], [place] [address]     Radiology Reports Dg Chest [place] 1 View  Result Date: 01/30/2019 CLINICAL DATA:  Coughing, nausea, and diarrhea. Patient diagnosed with COVID-19 2 weeks ago. EXAM: PORTABLE CHEST 1 VIEW COMPARISON:  01/20/2019 FINDINGS: New consolidation in the left mid lung compatible with active pneumonia. Hazy density in the right infrahilar and perihilar region could reflect early multilobar involvement. Cardiac and mediastinal margins appear normal. No blunting of the costophrenic angles. IMPRESSION: 1. New consolidation in the left mid lung compatible with active pneumonia. 2. Hazy density in the right perihilar and infrahilar region could reflect early multilobar pneumonia involvement. Electronically Signed   By: Gaylyn Rong M.D.   On: 01/30/2019 20:18   Dg Chest Portable 1 View  Result Date: 01/20/2019 CLINICAL DATA:  Shortness of breath and fever EXAM: PORTABLE CHEST 1 VIEW COMPARISON:  None. FINDINGS: The heart size and mediastinal contours are within normal limits. Both lungs are clear. The visualized skeletal structures are unremarkable. IMPRESSION: No active disease. Electronically Signed    By: Deatra Robinson M.D.   On: 01/20/2019 22:42    Lab Data:  CBC: Recent Labs  Lab 01/30/19 1805 01/31/19 0441 02/01/19 0354  WBC 8.9 9.2 11.3*  NEUTROABS 7.6 8.1* 9.9*  HGB 14.6 14.0 14.3  HCT 42.8 42.2 42.4  MCV 90.5 91.9 91.8  PLT 156 161 573   Basic Metabolic Panel: Recent Labs  Lab 01/30/19 1805 01/31/19 0441 02/01/19 0354  NA 131* 132* 133*  K 3.9 4.3 3.9  CL 97* 99 101  CO2 23 23 22   GLUCOSE 107* 150* 165*  BUN 15 15 19   CREATININE 0.98 0.83 0.78  CALCIUM 8.2* 8.1* 7.9*   GFR: Estimated Creatinine Clearance: 135.8 mL/min (by C-G formula based on SCr of 0.78 mg/dL). Liver Function Tests: Recent Labs  Lab 01/30/19 1805 01/31/19 0441 02/01/19 0354  AST 39 35 34  ALT 39 36 34  ALKPHOS 39 43 34*  BILITOT 0.9 0.6 0.9  PROT 7.0 6.6 6.6  ALBUMIN 3.4* 3.2* 3.1*   No results for input(s): LIPASE, AMYLASE in the last 168 hours. No results for input(s): AMMONIA in the last 168 hours. Coagulation Profile: No results for input(s): INR, PROTIME in the last 168 hours. Cardiac Enzymes: No results for input(s): CKTOTAL, CKMB, CKMBINDEX, TROPONINI in the last 168 hours. BNP (last 3 results) No results for input(s): PROBNP in the last 8760 hours. HbA1C: No results for input(s): HGBA1C in the last 72 hours. CBG: No results for input(s): GLUCAP in the last 168 hours. Lipid Profile: Recent Labs    01/30/19 1805  TRIG 88   Thyroid Function Tests: No results for input(s): TSH, T4TOTAL, FREET4, T3FREE, THYROIDAB in the last 72 hours. Anemia Panel: Recent Labs    01/31/19 0441 02/01/19 0354  FERRITIN 1,845* 1,749*   Urine analysis: No results found for: COLORURINE, APPEARANCEUR, LABSPEC, PHURINE, GLUCOSEU, HGBUR, BILIRUBINUR, KETONESUR, PROTEINUR, UROBILINOGEN, NITRITE, LEUKOCYTESUR   Kynzlie Hilleary M.D. [facility] 02/01/2019, 2:06 PM   Call night coverage person covering after 7pm

## 2019-02-01 NOTE — TOC Progression Note (Signed)
Transition of Care [facility]) - Progression Note    Patient Details  Name: Robert Davis MRN: [id number] Date of Birth: May 28, 1975  [facility] of [facility]) CM/SW Contact  Purcell Mouton, RN Phone Number: 02/01/2019, 5:04 PM  Clinical Narrative:    Pt from home with husband.   Expected Discharge Plan: Home/Self Care Barriers to Discharge: No Barriers Identified  Expected Discharge Plan and Services Expected Discharge Plan: Home/Self Care       Living arrangements for the past 2 months: Single Family Home                                       Social Determinants of Health (SDOH) Interventions    Readmission Risk Interventions No flowsheet data found.

## 2019-02-02 LAB — C-REACTIVE PROTEIN: CRP: 1.7 mg/dL — ABNORMAL HIGH (ref <1.0)

## 2019-02-02 LAB — COMPREHENSIVE METABOLIC PANEL
ALT: 30 U/L (ref 0–44)
AST: 31 U/L (ref 15–41)
Albumin: 3 g/dL — ABNORMAL LOW (ref 3.5–5.0)
Alkaline Phosphatase: 40 U/L (ref 38–126)
Anion gap: 11 (ref 5–15)
BUN: 19 mg/dL (ref 6–20)
CO2: 24 mmol/L (ref 22–32)
Calcium: 8.5 mg/dL — ABNORMAL LOW (ref 8.9–10.3)
Chloride: 102 mmol/L (ref 98–111)
Creatinine, Ser: 0.73 mg/dL (ref 0.61–1.24)
GFR calc Af Amer: >60 mL/min (ref >60)
GFR calc non Af Amer: >60 mL/min (ref >60)
Glucose, Bld: 140 mg/dL — ABNORMAL HIGH (ref 70–99)
Potassium: 4.4 mmol/L (ref 3.5–5.1)
Sodium: 137 mmol/L (ref 135–145)
Total Bilirubin: 0.2 mg/dL — ABNORMAL LOW (ref 0.3–1.2)
Total Protein: 6.6 g/dL (ref 6.5–8.1)

## 2019-02-02 LAB — CBC WITH DIFFERENTIAL/PLATELET
Abs Immature Granulocytes: 0.14 10*3/uL — ABNORMAL HIGH (ref 0.00–0.07)
Basophils Absolute: 0 10*3/uL (ref 0.0–0.1)
Basophils Relative: 0 %
Eosinophils Absolute: 0 10*3/uL (ref 0.0–0.5)
Eosinophils Relative: 0 %
HCT: 43 % (ref 39.0–52.0)
Hemoglobin: 14.2 g/dL (ref 13.0–17.0)
Immature Granulocytes: 1 %
Lymphocytes Relative: 5 %
Lymphs Abs: 0.7 10*3/uL (ref 0.7–4.0)
MCH: 30.5 pg (ref 26.0–34.0)
MCHC: 33 g/dL (ref 30.0–36.0)
MCV: 92.3 fL (ref 80.0–100.0)
Monocytes Absolute: 0.6 10*3/uL (ref 0.1–1.0)
Monocytes Relative: 4 %
Neutro Abs: 13.2 10*3/uL — ABNORMAL HIGH (ref 1.7–7.7)
Neutrophils Relative %: 90 %
Platelets: 228 10*3/uL (ref 150–400)
RBC: 4.66 MIL/uL (ref 4.22–5.81)
RDW: 12.9 % (ref 11.5–15.5)
WBC: 14.7 10*3/uL — ABNORMAL HIGH (ref 4.0–10.5)
nRBC: 0 % (ref 0.0–0.2)

## 2019-02-02 LAB — LACTATE DEHYDROGENASE: LDH: 292 U/L — ABNORMAL HIGH (ref 98–192)

## 2019-02-02 LAB — TYPE AND SCREEN
ABO/RH(D): A POS
Antibody Screen: NEGATIVE

## 2019-02-02 LAB — FERRITIN: Ferritin: 1157 ng/mL — ABNORMAL HIGH (ref 24–336)

## 2019-02-02 LAB — D-DIMER, QUANTITATIVE: D-Dimer, Quant: <0.27 ug/mL-FEU (ref 0.00–0.50)

## 2019-02-02 MED ORDER — SODIUM CHLORIDE 0.9% IV SOLUTION
Freq: Once | INTRAVENOUS | Status: AC
  Administered 2019-02-02: 16:00:00 via INTRAVENOUS

## 2019-02-02 MED ORDER — ALBUTEROL SULFATE HFA 108 (90 BASE) MCG/ACT IN AERS
2.0000 | INHALATION_SPRAY | RESPIRATORY_TRACT | Status: DC | PRN
  Filled 2019-02-02: qty 6.7

## 2019-02-02 MED ORDER — IPRATROPIUM-ALBUTEROL 20-100 MCG/ACT IN AERS
2.0000 | INHALATION_SPRAY | Freq: Four times a day (QID) | RESPIRATORY_TRACT | Status: DC
  Administered 2019-02-02 – 2019-02-03 (×6): 2 via RESPIRATORY_TRACT
  Filled 2019-02-02: qty 4

## 2019-02-02 MED ORDER — METHYLPREDNISOLONE SODIUM SUCC 40 MG IJ SOLR
40.0000 mg | Freq: Three times a day (TID) | INTRAMUSCULAR | Status: DC
  Administered 2019-02-02 – 2019-02-04 (×7): 40 mg via INTRAVENOUS
  Filled 2019-02-02 (×7): qty 1

## 2019-02-02 MED ORDER — TOCILIZUMAB 400 MG/20ML IV SOLN
720.0000 mg | Freq: Once | INTRAVENOUS | Status: AC
  Administered 2019-02-02: 720 mg via INTRAVENOUS
  Filled 2019-02-02: qty 36

## 2019-02-02 NOTE — Progress Notes (Signed)
[facility]                                                                              Patient Demographics  Robert Davis, is a 43 y.o. male, DOB - [birth year], KVQ:[id number]  Admit date - 01/30/2019   Admitting Physician Shela Leff, MD  Outpatient Primary MD for the patient is Fanny Bien, MD  Outpatient specialists:   LOS - 3  days   Medical records reviewed and are as summarized below:    Chief complaint; fevers      Brief summary   Patient is a 43 year old male with history of hypertension, hyperlipidemia, COVID-19 viral infection, presented to ED with acute onset, progressively worsening fevers, nausea vomiting and diarrhea.  Patient was seen in ED on 11/8 after attending an orthopedic equipment convention in [place] with shortness of breath cough and fever.  He tested positive at that time but was discharged home as he was found to be stable.  Patient reported that he was discharged from the ED with a steroid.  He initially improved however a night before admission he started having fevers 102 F, nausea and vomiting.  He also had one episode of nonbloody diarrhea. In ED temp, 103.2 F, tachycardiac, tachypneic, O2 sats 86% on room air, placed on 3 L O2. Inflammatory markers elevated, chest x-ray showed new consolidation in left midlung compatible with active pneumonia hazy density in the right perihilar and infrahilar region could reflect early multilobar pneumonia  Assessment & Plan    Principal Problem:  1. Acute Hypoxic Resp. Failure due to Acute Covid 19 Viral Pneumonitis during the ongoing 2020 Covid 19 Pandemic - POA -Patient was febrile, tachycardic, tachypneic, hypoxic at the time of admission with elevated inflammatory markers, explained by COVID-19 viral pneumonitis, sepsis, gastroenteritis.  Blood cultures negative. -Overnight progressively more hypoxic and short of breath.  O2 sats 90% on 6 L -Continue IV dexamethasone,  remdesivir per protocol.  Even though CRP trending down, patient's respiratory status worsening, will give 1 dose of Actemra, convalescent plasma.  Discussed pros and cons, side effects of Actemra with the patient, he is in full agreement of receiving tocilizumab.  Discussed about convalescent plasma, patient in full agreement of receiving plasma. -Follow LFTs.  BNP 23 -Recommended patient to prone, continue flutter valve, incentive spirometry -CT angiogram chest negative for PE.   - Continue to wean oxygen, ambulatory O2 screening daily as tolerated - Has + sick contact, likely at the conference. - Continue inhalers, cough suppressants, vitamins, supportive treatment, incentive spirometry - Continue to follow labs as below  Recent Labs  Lab 01/30/19 1805 01/30/19 2307 01/31/19 0441 02/01/19 0354 02/02/19 0401  DDIMER 0.86* 0.77* 0.62* 0.70* <0.27  FERRITIN 1,389* 1,847* 1,845* 1,749* 1,157*  CRP 9.0* 9.3* 10.2* 6.1* 1.7*  ALT 39  --  36 34 30  PROCALCITON <0.10 0.12  --   --   --     Active Problems:      Viral gastroenteritis: Likely due to COVID-19 -Diarrhea improving, currently no abdominal pain  -LFTs normal diarrhea improving, currently no abdominal pain.    Mild hypovolemic hyponatremia -Sodium improving,  137    HLD (hyperlipidemia) Continue Crestor  Code Status: Full CODE STATUS DVT Prophylaxis:  Lovenox  Family Communication: Called and updated patient's wife in detail on the phone   Disposition Plan:Pending clinical status, currently inpatient given acute hypoxic respiratory failure, acute COVID-19 illness.  Once ambulatory without overt symptoms or hypoxia would consider discharge home.  Time Spent in minutes 25 minutes  Procedures:  None  Consultants:   None  Antimicrobials:   Anti-infectives (From admission, onward)   Start     Dose/Rate Route Frequency Ordered Stop   01/31/19 2200  remdesivir 100 mg in sodium chloride 0.9 % 250 mL IVPB     100 mg  500 mL/hr over 30 Minutes Intravenous Every 24 hours 01/30/19 2201 02/04/19 2159   01/30/19 2215  remdesivir 200 mg in sodium chloride 0.9 % 250 mL IVPB     200 mg 500 mL/hr over 30 Minutes Intravenous Once 01/30/19 2201 01/31/19 0100         Medications  Scheduled Meds: . sodium chloride   Intravenous Once  . benzonatate  200 mg Oral TID  . enoxaparin (LOVENOX) injection  40 mg Subcutaneous Q24H  . Ipratropium-Albuterol  2 puff Inhalation Q6H  . loratadine  10 mg Oral Daily  . methylPREDNISolone (SOLU-MEDROL) injection  40 mg Intravenous Q8H  . rosuvastatin  5 mg Oral Daily  . vitamin C  500 mg Oral Daily  . zinc sulfate  220 mg Oral Daily   Continuous Infusions: . remdesivir 100 mg in NS 250 mL Stopped (02/01/19 2249)   PRN Meds:.acetaminophen, albuterol, guaiFENesin-dextromethorphan, loperamide, ondansetron (ZOFRAN) IV      Subjective:   Robert Davis was seen and examined today.  Somewhat more short of breath and hypoxic since overnight.  O2 sats 90% on 6 L.  No nausea vomiting or abdominal pain.  Diarrhea improving.  No fevers this morning.    Objective:   Vitals:   02/01/19 2247 02/02/19 0521 02/02/19 0904 02/02/19 1315  BP: (!) 136/91 (!) 138/94  131/89  Pulse: 75 69  81  Resp: 20 16  (!) 24  Temp: 98.7 F (37.1 C) 98.5 F (36.9 C)  98.7 F (37.1 C)  TempSrc: Oral Oral  Oral  SpO2: 96% 92% 93% 90%  Weight:      Height:        Intake/Output Summary (Last 24 hours) at 02/02/2019 1526 Last data filed at 02/02/2019 0519 Gross per 24 hour  Intake 740 ml  Output -  Net 740 ml     Wt Readings from Last 3 Encounters:  01/30/19 90.7 kg  01/20/19 95.3 kg   Physical Exam  General: Alert and oriented x 3, NAD  Eyes:   HEENT:  Atraumatic, normocephalic  Cardiovascular: S1 S2 clear, no murmurs, RRR. No pedal edema b/l  Respiratory: Diminished breath sounds with bibasilar crackles  Gastrointestinal: Soft, nontender, nondistended, NBS  Ext: no  pedal edema bilaterally  Neuro: no new deficits  Musculoskeletal: No cyanosis, clubbing  Skin: No rashes  Psych: Normal affect and demeanor, alert and oriented x3       Data Reviewed:  I have personally reviewed following labs and imaging studies  Micro Results Recent Results (from the past 240 hour(s))  Blood Culture (routine x 2)     Status: None (Preliminary result)   Collection Time: 01/30/19  6:10 PM   Specimen: BLOOD  Result Value Ref Range Status   Specimen Description   Final    BLOOD  RIGHT ANTECUBITAL Performed at [facility], [address], [place], [place] [address]    Special Requests   Final    BOTTLES DRAWN AEROBIC AND ANAEROBIC Blood Culture adequate volume Performed at [facility], [address], [place], [place] [address]    Culture   Final    NO GROWTH 3 DAYS Performed at [facility], [address] [place], [place] [address]    Report Status PENDING  Incomplete  Blood Culture (routine x 2)     Status: None (Preliminary result)   Collection Time: 01/30/19  6:23 PM   Specimen: BLOOD  Result Value Ref Range Status   Specimen Description   Final    BLOOD LEFT ANTECUBITAL Performed at [facility], [address] [place], [place] [address]    Special Requests   Final    BOTTLES DRAWN AEROBIC AND ANAEROBIC Blood Culture adequate volume Performed at [facility], [address], [place], [place] [address]    Culture   Final    NO GROWTH 3 DAYS Performed at [facility], [address] [place], [place] [address]    Report Status PENDING  Incomplete  SARS CORONAVIRUS 2 (TAT 6-24 HRS) Nasopharyngeal Nasopharyngeal Swab     Status: Abnormal   Collection Time: 01/31/19  9:24 AM   Specimen: Nasopharyngeal Swab  Result Value Ref Range Status   SARS Coronavirus 2 POSITIVE (A) NEGATIVE Final    Comment: RESULT CALLED TO, READ BACK BY AND VERIFIED WITH: Freida Busman RN 15:05 01/31/19  ([email or web address]) (NOTE) SARS-CoV-2 target nucleic acids are DETECTED. The SARS-CoV-2 RNA is generally detectable in upper and lower respiratory specimens during the acute phase of infection. Positive results are indicative of active infection with SARS-CoV-2. Clinical  correlation with patient history and other diagnostic information is necessary to determine patient infection status. Positive results do  not rule out bacterial infection or co-infection with other viruses. The expected result is Negative. Fact Sheet for Patients: HairSlick.no Fact Sheet for Healthcare Providers: quierodirigir.com This test is not yet approved or cleared by the [place] FDA and  has been authorized for detection and/or diagnosis of SARS-CoV-2 by FDA under an Emergency Use Authorization (EUA). This EUA will remain  in effect (meaning this test can be used) f or the duration of the COVID-19 declaration under Section 564(b)(1) of the Act, 21 U.S.C. section 360bbb-3(b)(1), unless the authorization is terminated or revoked sooner. Performed at [facility], [address] [place], [place] [address]     Radiology Reports Ct Angio Chest Pe W Or Wo Contrast  Result Date: 02/01/2019 CLINICAL DATA:  PE suspected, intermediate prob, positive D-dimer. COVID-19 positive EXAM: CT ANGIOGRAPHY CHEST WITH CONTRAST TECHNIQUE: Multidetector CT imaging of the chest was performed using the standard protocol during bolus administration of intravenous contrast. Multiplanar CT image reconstructions and MIPs were obtained to evaluate the vascular anatomy. CONTRAST:  OMNIPAQUE IOHEXOL 350 MG/ML SOLN COMPARISON:  Radiographs 01/30/2019 FINDINGS: Cardiovascular: There are no filling defects within the pulmonary arteries to suggest pulmonary embolus. Subsegmental branches in the mid and lower lung zones partially obscured by breathing motion artifact. Normal caliber  thoracic aorta without dissection. Heart is normal in size. No pericardial effusion. Mediastinum/Nodes: Shotty subcarinal nodes remain subcentimeter, likely reactive. No hilar adenopathy. Decompressed esophagus. No visualized thyroid nodule. Lungs/Pleura: Diffuse bilateral geographic ground-glass opacities, most prominent in the lower lobes. No pleural fluid. Trachea and mainstem bronchi are patent.  Upper Abdomen: No acute abnormality. Musculoskeletal: There are no acute or suspicious osseous abnormalities. Bone island in the right proximal humerus. Review of the MIP images confirms the above findings. IMPRESSION: 1. No pulmonary embolus, lower lobe evaluation slightly limited by breathing motion artifact. 2. Diffuse bilateral geographic ground-glass opacities, most prominent in the lower lobes consistent with COVID-19 pneumonia. Pulmonary findings have progressed from radiograph 2 days ago. Electronically Signed   By: Narda Rutherford M.D.   On: 02/01/2019 22:32   Dg Chest Port 1 View  Result Date: 01/30/2019 CLINICAL DATA:  Coughing, nausea, and diarrhea. Patient diagnosed with COVID-19 2 weeks ago. EXAM: PORTABLE CHEST 1 VIEW COMPARISON:  01/20/2019 FINDINGS: New consolidation in the left mid lung compatible with active pneumonia. Hazy density in the right infrahilar and perihilar region could reflect early multilobar involvement. Cardiac and mediastinal margins appear normal. No blunting of the costophrenic angles. IMPRESSION: 1. New consolidation in the left mid lung compatible with active pneumonia. 2. Hazy density in the right perihilar and infrahilar region could reflect early multilobar pneumonia involvement. Electronically Signed   By: Gaylyn Rong M.D.   On: 01/30/2019 20:18   Dg Chest Portable 1 View  Result Date: 01/20/2019 CLINICAL DATA:  Shortness of breath and fever EXAM: PORTABLE CHEST 1 VIEW COMPARISON:  None. FINDINGS: The heart size and mediastinal contours are within normal  limits. Both lungs are clear. The visualized skeletal structures are unremarkable. IMPRESSION: No active disease. Electronically Signed   By: Deatra Robinson M.D.   On: 01/20/2019 22:42    Lab Data:  CBC: Recent Labs  Lab 01/30/19 1805 01/31/19 0441 02/01/19 0354 02/02/19 0401  WBC 8.9 9.2 11.3* 14.7*  NEUTROABS 7.6 8.1* 9.9* 13.2*  HGB 14.6 14.0 14.3 14.2  HCT 42.8 42.2 42.4 43.0  MCV 90.5 91.9 91.8 92.3  PLT 156 161 176 228   Basic Metabolic Panel: Recent Labs  Lab 01/30/19 1805 01/31/19 0441 02/01/19 0354 02/02/19 0401  NA 131* 132* 133* 137  K 3.9 4.3 3.9 4.4  CL 97* 99 101 102  CO2 GLUCOSE 107* 150* 165* 140*  BUN CREATININE 0.98 0.83 0.78 0.73  CALCIUM 8.2* 8.1* 7.9* 8.5*   GFR: Estimated Creatinine Clearance: 135.8 mL/min (by C-G formula based on SCr of 0.73 mg/dL). Liver Function Tests: Recent Labs  Lab 01/30/19 1805 01/31/19 0441 02/01/19 0354 02/02/19 0401  AST 39 35 34 31  ALT 39 36 34 30  ALKPHOS 39 43 34* 40  BILITOT 0.9 0.6 0.9 0.2*  PROT 7.0 6.6 6.6 6.6  ALBUMIN 3.4* 3.2* 3.1* 3.0*   No results for input(s): LIPASE, AMYLASE in the last 168 hours. No results for input(s): AMMONIA in the last 168 hours. Coagulation Profile: No results for input(s): INR, PROTIME in the last 168 hours. Cardiac Enzymes: No results for input(s): CKTOTAL, CKMB, CKMBINDEX, TROPONINI in the last 168 hours. BNP (last 3 results) No results for input(s): PROBNP in the last 8760 hours. HbA1C: No results for input(s): HGBA1C in the last 72 hours. CBG: No results for input(s): GLUCAP in the last 168 hours. Lipid Profile: Recent Labs    01/30/19 1805  TRIG 88   Thyroid Function Tests: No results for input(s): TSH, T4TOTAL, FREET4, T3FREE, THYROIDAB in the last 72 hours. Anemia Panel: Recent Labs    02/01/19 0354 02/02/19 0401  FERRITIN 1,749* 1,157*   Urine analysis: No results found for: COLORURINE, APPEARANCEUR, LABSPEC, PHURINE,  GLUCOSEU, HGBUR,  BILIRUBINUR, KETONESUR, PROTEINUR, UROBILINOGEN, NITRITE, LEUKOCYTESUR   Ripudeep Rai M.D. [facility] 02/02/2019, 3:26 PM   Call night coverage person covering after 7pm

## 2019-02-02 NOTE — Plan of Care (Signed)
Patient up to 6 liters to maintain oxygen saturation >90%.  Patient did remain prone for significant portion of 7 a to 7 p shift, oxygen saturation improved while proning.  Up to chair to eat lunch, continues to complain of significant dyspnea on exertion.  Received convalescent plasma this shift.

## 2019-02-02 NOTE — Plan of Care (Signed)
  Problem: Education: Goal: Knowledge of risk factors and measures for prevention of condition will improve Outcome: Progressing   Problem: Coping: Goal: Psychosocial and spiritual needs will be supported Outcome: Progressing   Problem: Respiratory: Goal: Will maintain a patent airway Outcome: Progressing Goal: Complications related to the disease process, condition or treatment will be avoided or minimized Outcome: Progressing   Problem: Health Behavior/Discharge Planning: Goal: Ability to manage health-related needs will improve Outcome: Progressing   Problem: Clinical Measurements: Goal: Ability to maintain clinical measurements within normal limits will improve Outcome: Progressing Goal: Will remain free from infection Outcome: Progressing Goal: Diagnostic test results will improve Outcome: Progressing Goal: Respiratory complications will improve Outcome: Progressing Goal: Cardiovascular complication will be avoided Outcome: Progressing   Problem: Activity: Goal: Risk for activity intolerance will decrease Outcome: Progressing   Problem: Nutrition: Goal: Adequate nutrition will be maintained Outcome: Progressing   Problem: Coping: Goal: Level of anxiety will decrease Outcome: Progressing   Problem: Elimination: Goal: Will not experience complications related to bowel motility Outcome: Progressing Goal: Will not experience complications related to urinary retention Outcome: Progressing   Problem: Pain Managment: Goal: General experience of comfort will improve Outcome: Progressing   Problem: Safety: Goal: Ability to remain free from injury will improve Outcome: Progressing   Problem: Skin Integrity: Goal: Risk for impaired skin integrity will decrease Outcome: Progressing   

## 2019-02-03 ENCOUNTER — Inpatient Hospital Stay (HOSPITAL_COMMUNITY): Payer: BC Managed Care – PPO

## 2019-02-03 DIAGNOSIS — J069 Acute upper respiratory infection, unspecified: Secondary | ICD-10-CM

## 2019-02-03 LAB — CBC WITH DIFFERENTIAL/PLATELET
Abs Immature Granulocytes: 0.08 10*3/uL — ABNORMAL HIGH (ref 0.00–0.07)
Basophils Absolute: 0 10*3/uL (ref 0.0–0.1)
Basophils Relative: 0 %
Eosinophils Absolute: 0 10*3/uL (ref 0.0–0.5)
Eosinophils Relative: 0 %
HCT: 43.2 % (ref 39.0–52.0)
Hemoglobin: 14.2 g/dL (ref 13.0–17.0)
Immature Granulocytes: 1 %
Lymphocytes Relative: 6 %
Lymphs Abs: 0.6 10*3/uL — ABNORMAL LOW (ref 0.7–4.0)
MCH: 30.3 pg (ref 26.0–34.0)
MCHC: 32.9 g/dL (ref 30.0–36.0)
MCV: 92.1 fL (ref 80.0–100.0)
Monocytes Absolute: 0.4 10*3/uL (ref 0.1–1.0)
Monocytes Relative: 4 %
Neutro Abs: 10 10*3/uL — ABNORMAL HIGH (ref 1.7–7.7)
Neutrophils Relative %: 89 %
Platelets: 242 10*3/uL (ref 150–400)
RBC: 4.69 MIL/uL (ref 4.22–5.81)
RDW: 12.7 % (ref 11.5–15.5)
WBC: 11.1 10*3/uL — ABNORMAL HIGH (ref 4.0–10.5)
nRBC: 0 % (ref 0.0–0.2)

## 2019-02-03 LAB — D-DIMER, QUANTITATIVE: D-Dimer, Quant: 0.36 ug/mL-FEU (ref 0.00–0.50)

## 2019-02-03 LAB — COMPREHENSIVE METABOLIC PANEL
ALT: 27 U/L (ref 0–44)
AST: 25 U/L (ref 15–41)
Albumin: 3 g/dL — ABNORMAL LOW (ref 3.5–5.0)
Alkaline Phosphatase: 38 U/L (ref 38–126)
Anion gap: 9 (ref 5–15)
BUN: 22 mg/dL — ABNORMAL HIGH (ref 6–20)
CO2: 23 mmol/L (ref 22–32)
Calcium: 8.4 mg/dL — ABNORMAL LOW (ref 8.9–10.3)
Chloride: 103 mmol/L (ref 98–111)
Creatinine, Ser: 0.69 mg/dL (ref 0.61–1.24)
GFR calc Af Amer: >60 mL/min (ref >60)
GFR calc non Af Amer: >60 mL/min (ref >60)
Glucose, Bld: 144 mg/dL — ABNORMAL HIGH (ref 70–99)
Potassium: 4.6 mmol/L (ref 3.5–5.1)
Sodium: 135 mmol/L (ref 135–145)
Total Bilirubin: 0.3 mg/dL (ref 0.3–1.2)
Total Protein: 6.4 g/dL — ABNORMAL LOW (ref 6.5–8.1)

## 2019-02-03 LAB — BPAM FFP
Blood Product Expiration Date: 202011221347
ISSUE DATE / TIME: 202011211535
Unit Type and Rh: 6200

## 2019-02-03 LAB — PREPARE FRESH FROZEN PLASMA

## 2019-02-03 LAB — C-REACTIVE PROTEIN: CRP: 0.9 mg/dL (ref <1.0)

## 2019-02-03 LAB — FERRITIN: Ferritin: 712 ng/mL — ABNORMAL HIGH (ref 24–336)

## 2019-02-03 LAB — LACTATE DEHYDROGENASE: LDH: 251 U/L — ABNORMAL HIGH (ref 98–192)

## 2019-02-03 LAB — BRAIN NATRIURETIC PEPTIDE: B Natriuretic Peptide: 43.8 pg/mL (ref 0.0–100.0)

## 2019-02-03 MED ORDER — IPRATROPIUM-ALBUTEROL 20-100 MCG/ACT IN AERS
2.0000 | INHALATION_SPRAY | Freq: Three times a day (TID) | RESPIRATORY_TRACT | Status: DC
  Administered 2019-02-04 – 2019-02-05 (×4): 2 via RESPIRATORY_TRACT

## 2019-02-03 MED ORDER — FUROSEMIDE 10 MG/ML IJ SOLN
40.0000 mg | Freq: Once | INTRAMUSCULAR | Status: AC
  Administered 2019-02-03: 40 mg via INTRAVENOUS
  Filled 2019-02-03: qty 4

## 2019-02-03 NOTE — Plan of Care (Signed)
Patient remained on 5 liters oxygen throughout shift, maintains oxygen saturation in the low to mid 90's on 5 liters unless he exerts himself then will drop down to high 80's.  Patient overall states he feels improved and feels his shortness of breath has improved as well.

## 2019-02-03 NOTE — Progress Notes (Signed)
[facility]                                                                              Patient Demographics  Robert Davis, is a 43 y.o. male, DOB - 12/13/1975, GUR:[id number]  Admit date - 01/30/2019   Admitting Physician Shela Leff, MD  Outpatient Primary MD for the patient is Fanny Bien, MD  Outpatient specialists:   LOS - 4  days   Medical records reviewed and are as summarized below:    Chief complaint; fevers      Brief summary   Patient is a 43 year old male with history of hypertension, hyperlipidemia, COVID-19 viral infection, presented to ED with acute onset, progressively worsening fevers, nausea vomiting and diarrhea.  Patient was seen in ED on 11/8 after attending an orthopedic equipment convention in [place] with shortness of breath cough and fever.  He tested positive at that time but was discharged home as he was found to be stable.  Patient reported that he was discharged from the ED with a steroid.  He initially improved however a night before admission he started having fevers 102 F, nausea and vomiting.  He also had one episode of nonbloody diarrhea. In ED temp, 103.2 F, tachycardiac, tachypneic, O2 sats 86% on room air, placed on 3 L O2. Inflammatory markers elevated, chest x-ray showed new consolidation in left midlung compatible with active pneumonia hazy density in the right perihilar and infrahilar region could reflect early multilobar pneumonia  Assessment & Plan    Principal Problem:  1. Acute Hypoxic Resp. Failure due to Acute Covid 19 Viral Pneumonitis during the ongoing 2020 Covid 19 Pandemic - POA - CT angiogram of the chest showed no pulmonary embolus, diffuse bilateral groundglass opacities in lower lobes -At the time of my examination O2 sats 91 to 94% on 5 L O2 via nasal cannula.  Afebrile today.  Blood cultures remain negative  -Continue IV dexamethasone, remdesivir protocol, day #5 today.  Due to sudden  worsening of respiratory symptoms overnight, patient was given 1 dose of Actemra and convalescent plasma on 11/21.  I's and O's with 3.9 L positive, gave Lasix 40 mg IV x1 -Recommended to continue to prone, flutter valve, incentive spirometry.  Wean O2 as tolerated. - ambulatory home O2 screening upon discharge. - Has + sick contact, likely at the conference. - Continue inhalers, cough suppressants, vitamins, supportive treatment, incentive spirometry - Continue to follow labs as below  Recent Labs  Lab 01/30/19 1805 01/30/19 2307 01/31/19 0441 02/01/19 0354 02/02/19 0401 02/03/19 0239  DDIMER 0.86* 0.77* 0.62* 0.70* <0.27 0.36  FERRITIN 1,389* 1,847* 1,845* 1,749* 1,157* 712*  CRP 9.0* 9.3* 10.2* 6.1* 1.7* 0.9  ALT 39  --  36 34 30 27  PROCALCITON <0.10 0.12  --   --   --   --     Active Problems:      Viral gastroenteritis: Likely due to COVID-19 -Diarrhea improving, LFTs normal, no abdominal pain  Mild hypovolemic hyponatremia -Sodium stable, 135    HLD (hyperlipidemia) Continue Crestor  Code Status: Full CODE STATUS DVT Prophylaxis:  Lovenox  Family Communication: Called and  updated all lab results, imagings and clinical status to the patient's wife on the phone   Disposition Plan:Pending clinical status, currently inpatient given acute hypoxic respiratory failure, acute COVID-19 illness.  Once ambulatory without overt symptoms or hypoxia would consider discharge home, possible dc home in am.  Time Spent in minutes 25 minutes  Procedures:  None  Consultants:   None  Antimicrobials:   Anti-infectives (From admission, onward)   Start     Dose/Rate Route Frequency Ordered Stop   01/31/19 2200  remdesivir 100 mg in sodium chloride 0.9 % 250 mL IVPB     100 mg 500 mL/hr over 30 Minutes Intravenous Every 24 hours 01/30/19 2201 02/04/19 2159   01/30/19 2215  remdesivir 200 mg in sodium chloride 0.9 % 250 mL IVPB     200 mg 500 mL/hr over 30 Minutes Intravenous  Once 01/30/19 2201 01/31/19 0100         Medications  Scheduled Meds: . benzonatate  200 mg Oral TID  . enoxaparin (LOVENOX) injection  40 mg Subcutaneous Q24H  . Ipratropium-Albuterol  2 puff Inhalation Q6H  . loratadine  10 mg Oral Daily  . methylPREDNISolone (SOLU-MEDROL) injection  40 mg Intravenous Q8H  . rosuvastatin  5 mg Oral Daily  . vitamin C  500 mg Oral Daily  . zinc sulfate  220 mg Oral Daily   Continuous Infusions: . remdesivir 100 mg in NS 250 mL Stopped (02/02/19 2221)   PRN Meds:.acetaminophen, albuterol, guaiFENesin-dextromethorphan, loperamide, ondansetron (ZOFRAN) IV      Subjective:   Robert Davis was seen and examined today.  Mild improvement in shortness of breath, down to 5 L O2.  Does not wear any O2 at home.  No fevers today.  No chest pain.  No nausea or vomiting.  Diarrhea improving.  States he stayed in prone position a lot yesterday.  Objective:   Vitals:   02/02/19 2129 02/03/19 0518 02/03/19 1251 02/03/19 1340  BP:  124/81 127/84 125/77  Pulse:  68 68 80  Resp:  14 (!) 23 20  Temp:  98.1 F (36.7 C) 98.5 F (36.9 C) 98.1 F (36.7 C)  TempSrc:  Oral Oral Oral  SpO2: 94% 95% 95% 91%  Weight:      Height:        Intake/Output Summary (Last 24 hours) at 02/03/2019 1431 Last data filed at 02/03/2019 1339 Gross per 24 hour  Intake 942.66 ml  Output 900 ml  Net 42.66 ml     Wt Readings from Last 3 Encounters:  01/30/19 90.7 kg  01/20/19 95.3 kg   Physical Exam  General: Alert and oriented x 3, NAD  Eyes:   HEENT:  Atraumatic, normocephalic  Cardiovascular: S1 S2 clear, no murmurs, RRR. No pedal edema b/l  Respiratory: Diminished breath sounds at the bases with bibasilar crackles  Gastrointestinal: Soft, nontender, nondistended, NBS  Ext: no pedal edema bilaterally  Neuro: no new deficits  Musculoskeletal: No cyanosis, clubbing  Skin: No rashes  Psych: Normal affect and demeanor, alert and oriented x3       Data Reviewed:  I have personally reviewed following labs and imaging studies  Micro Results Recent Results (from the past 240 hour(s))  Blood Culture (routine x 2)     Status: None (Preliminary result)   Collection Time: 01/30/19  6:10 PM   Specimen: BLOOD  Result Value Ref Range Status   Specimen Description   Final    BLOOD RIGHT ANTECUBITAL Performed at [facility], [address], [place], [place] [address]    Special Requests   Final    BOTTLES DRAWN AEROBIC AND ANAEROBIC Blood Culture adequate volume Performed at [facility], [address] [place], [place] [address]    Culture   Final    NO GROWTH 4 DAYS Performed at [facility], [address], [place], [place] [address]    Report Status PENDING  Incomplete  Blood Culture (routine x 2)     Status: None (Preliminary result)   Collection Time: 01/30/19  6:23 PM   Specimen: BLOOD  Result Value Ref Range Status   Specimen Description   Final    BLOOD LEFT ANTECUBITAL Performed at [facility], [address] [place], [place] [address]    Special Requests   Final    BOTTLES DRAWN AEROBIC AND ANAEROBIC Blood Culture adequate volume Performed at [facility], [address], [place], [place] [address]    Culture   Final    NO GROWTH 4 DAYS Performed at [facility], [address], [place], [place] [address]    Report Status PENDING  Incomplete  SARS CORONAVIRUS 2 (TAT 6-24 HRS) Nasopharyngeal Nasopharyngeal Swab     Status: Abnormal   Collection Time: 01/31/19  9:24 AM   Specimen: Nasopharyngeal Swab  Result Value Ref Range Status   SARS Coronavirus 2 POSITIVE (A) NEGATIVE Final    Comment: RESULT CALLED TO, READ BACK BY AND VERIFIED WITH: Freida Busman RN 15:05 01/31/19 ([email or web address]) (NOTE) SARS-CoV-2 target nucleic acids are DETECTED. The SARS-CoV-2 RNA is generally detectable in upper and lower respiratory specimens during the acute phase of infection.  Positive results are indicative of active infection with SARS-CoV-2. Clinical  correlation with patient history and other diagnostic information is necessary to determine patient infection status. Positive results do  not rule out bacterial infection or co-infection with other viruses. The expected result is Negative. Fact Sheet for Patients: HairSlick.no Fact Sheet for Healthcare Providers: quierodirigir.com This test is not yet approved or cleared by the [place] FDA and  has been authorized for detection and/or diagnosis of SARS-CoV-2 by FDA under an Emergency Use Authorization (EUA). This EUA will remain  in effect (meaning this test can be used) f or the duration of the COVID-19 declaration under Section 564(b)(1) of the Act, 21 U.S.C. section 360bbb-3(b)(1), unless the authorization is terminated or revoked sooner. Performed at [facility], [address] [place], [place] [address]     Radiology Reports Ct Angio Chest Pe W Or Wo Contrast  Result Date: 02/01/2019 CLINICAL DATA:  PE suspected, intermediate prob, positive D-dimer. COVID-19 positive EXAM: CT ANGIOGRAPHY CHEST WITH CONTRAST TECHNIQUE: Multidetector CT imaging of the chest was performed using the standard protocol during bolus administration of intravenous contrast. Multiplanar CT image reconstructions and MIPs were obtained to evaluate the vascular anatomy. CONTRAST:  OMNIPAQUE IOHEXOL 350 MG/ML SOLN COMPARISON:  Radiographs 01/30/2019 FINDINGS: Cardiovascular: There are no filling defects within the pulmonary arteries to suggest pulmonary embolus. Subsegmental branches in the mid and lower lung zones partially obscured by breathing motion artifact. Normal caliber thoracic aorta without dissection. Heart is normal in size. No pericardial effusion. Mediastinum/Nodes: Shotty subcarinal nodes remain subcentimeter, likely reactive. No hilar adenopathy.  Decompressed esophagus. No visualized thyroid nodule. Lungs/Pleura: Diffuse bilateral geographic ground-glass opacities, most prominent in the lower lobes. No pleural fluid. Trachea and mainstem bronchi are patent. Upper Abdomen: No acute abnormality. Musculoskeletal:  There are no acute or suspicious osseous abnormalities. Bone island in the right proximal humerus. Review of the MIP images confirms the above findings. IMPRESSION: 1. No pulmonary embolus, lower lobe evaluation slightly limited by breathing motion artifact. 2. Diffuse bilateral geographic ground-glass opacities, most prominent in the lower lobes consistent with COVID-19 pneumonia. Pulmonary findings have progressed from radiograph 2 days ago. Electronically Signed   By: Narda Rutherford M.D.   On: 02/01/2019 22:32   Dg Chest Port 1 View  Result Date: 02/03/2019 CLINICAL DATA:  Acute respiratory disease. EXAM: PORTABLE CHEST 1 VIEW COMPARISON:  01/30/2019 FINDINGS: 0409 hours. Low volumes. Cardiopericardial silhouette is at upper limits of normal for size. Patchy bilateral airspace disease, left greater than right, similar to prior. No pleural effusion. The visualized bony structures of the thorax are intact. Telemetry leads overlie the chest. IMPRESSION: Stable exam. Patchy bilateral airspace disease, left greater than right. Electronically Signed   By: Kennith Center M.D.   On: 02/03/2019 07:06   Dg Chest Port 1 View  Result Date: 01/30/2019 CLINICAL DATA:  Coughing, nausea, and diarrhea. Patient diagnosed with COVID-19 2 weeks ago. EXAM: PORTABLE CHEST 1 VIEW COMPARISON:  01/20/2019 FINDINGS: New consolidation in the left mid lung compatible with active pneumonia. Hazy density in the right infrahilar and perihilar region could reflect early multilobar involvement. Cardiac and mediastinal margins appear normal. No blunting of the costophrenic angles. IMPRESSION: 1. New consolidation in the left mid lung compatible with active pneumonia. 2.  Hazy density in the right perihilar and infrahilar region could reflect early multilobar pneumonia involvement. Electronically Signed   By: Gaylyn Rong M.D.   On: 01/30/2019 20:18   Dg Chest Portable 1 View  Result Date: 01/20/2019 CLINICAL DATA:  Shortness of breath and fever EXAM: PORTABLE CHEST 1 VIEW COMPARISON:  None. FINDINGS: The heart size and mediastinal contours are within normal limits. Both lungs are clear. The visualized skeletal structures are unremarkable. IMPRESSION: No active disease. Electronically Signed   By: Deatra Robinson M.D.   On: 01/20/2019 22:42    Lab Data:  CBC: Recent Labs  Lab 01/30/19 1805 01/31/19 0441 02/01/19 0354 02/02/19 0401 02/03/19 0239  WBC 8.9 9.2 11.3* 14.7* 11.1*  NEUTROABS 7.6 8.1* 9.9* 13.2* 10.0*  HGB 14.6 14.0 14.3 14.2 14.2  HCT 42.8 42.2 42.4 43.0 43.2  MCV 90.5 91.9 91.8 92.3 92.1  PLT 156 161 176 228 242   Basic Metabolic Panel: Recent Labs  Lab 01/30/19 1805 01/31/19 0441 02/01/19 0354 02/02/19 0401 02/03/19 0239  NA 131* 132* 133* 137 135  K 3.9 4.3 3.9 4.4 4.6  CL 97* 99 101 102 103  CO2 GLUCOSE 107* 150* 165* 140* 144*  BUN 22*  CREATININE 0.98 0.83 0.78 0.73 0.69  CALCIUM 8.2* 8.1* 7.9* 8.5* 8.4*   GFR: Estimated Creatinine Clearance: 135.8 mL/min (by C-G formula based on SCr of 0.69 mg/dL). Liver Function Tests: Recent Labs  Lab 01/30/19 1805 01/31/19 0441 02/01/19 0354 02/02/19 0401 02/03/19 0239  AST 39 35 34 31 25  ALT 39 36 34 30 27  ALKPHOS 39 43 34* 40 38  BILITOT 0.9 0.6 0.9 0.2* 0.3  PROT 7.0 6.6 6.6 6.6 6.4*  ALBUMIN 3.4* 3.2* 3.1* 3.0* 3.0*   No results for input(s): LIPASE, AMYLASE in the last 168 hours. No results for input(s): AMMONIA in the last 168 hours. Coagulation Profile: No results for input(s): INR, PROTIME in the last 168  hours. Cardiac Enzymes: No results for input(s): CKTOTAL, CKMB, CKMBINDEX, TROPONINI in the last 168 hours. BNP (last 3  results) No results for input(s): PROBNP in the last 8760 hours. HbA1C: No results for input(s): HGBA1C in the last 72 hours. CBG: No results for input(s): GLUCAP in the last 168 hours. Lipid Profile: No results for input(s): CHOL, HDL, LDLCALC, TRIG, CHOLHDL, LDLDIRECT in the last 72 hours. Thyroid Function Tests: No results for input(s): TSH, T4TOTAL, FREET4, T3FREE, THYROIDAB in the last 72 hours. Anemia Panel: Recent Labs    02/02/19 0401 02/03/19 0239  FERRITIN 1,157* 712*   Urine analysis: No results found for: COLORURINE, APPEARANCEUR, LABSPEC, PHURINE, GLUCOSEU, HGBUR, BILIRUBINUR, KETONESUR, PROTEINUR, UROBILINOGEN, NITRITE, LEUKOCYTESUR   Ripudeep Rai M.D. [facility] 02/03/2019, 2:31 PM   Call night coverage person covering after 7pm

## 2019-02-03 NOTE — Plan of Care (Signed)
  Problem: Education: Goal: Knowledge of risk factors and measures for prevention of condition will improve Outcome: Progressing   Problem: Coping: Goal: Psychosocial and spiritual needs will be supported Outcome: Progressing   Problem: Respiratory: Goal: Will maintain a patent airway Outcome: Progressing Goal: Complications related to the disease process, condition or treatment will be avoided or minimized Outcome: Progressing   Problem: Health Behavior/Discharge Planning: Goal: Ability to manage health-related needs will improve Outcome: Progressing   Problem: Clinical Measurements: Goal: Ability to maintain clinical measurements within normal limits will improve Outcome: Progressing Goal: Will remain free from infection Outcome: Progressing Goal: Diagnostic test results will improve Outcome: Progressing Goal: Respiratory complications will improve Outcome: Progressing Goal: Cardiovascular complication will be avoided Outcome: Progressing   Problem: Activity: Goal: Risk for activity intolerance will decrease Outcome: Progressing   Problem: Nutrition: Goal: Adequate nutrition will be maintained Outcome: Progressing   Problem: Coping: Goal: Level of anxiety will decrease Outcome: Progressing   Problem: Elimination: Goal: Will not experience complications related to bowel motility Outcome: Progressing Goal: Will not experience complications related to urinary retention Outcome: Progressing   Problem: Pain Managment: Goal: General experience of comfort will improve Outcome: Progressing   Problem: Safety: Goal: Ability to remain free from injury will improve Outcome: Progressing   Problem: Skin Integrity: Goal: Risk for impaired skin integrity will decrease Outcome: Progressing   

## 2019-02-04 LAB — COMPREHENSIVE METABOLIC PANEL
ALT: 37 U/L (ref 0–44)
AST: 29 U/L (ref 15–41)
Albumin: 2.9 g/dL — ABNORMAL LOW (ref 3.5–5.0)
Alkaline Phosphatase: 40 U/L (ref 38–126)
Anion gap: 8 (ref 5–15)
BUN: 27 mg/dL — ABNORMAL HIGH (ref 6–20)
CO2: 25 mmol/L (ref 22–32)
Calcium: 8.4 mg/dL — ABNORMAL LOW (ref 8.9–10.3)
Chloride: 106 mmol/L (ref 98–111)
Creatinine, Ser: 0.73 mg/dL (ref 0.61–1.24)
GFR calc Af Amer: >60 mL/min (ref >60)
GFR calc non Af Amer: >60 mL/min (ref >60)
Glucose, Bld: 157 mg/dL — ABNORMAL HIGH (ref 70–99)
Potassium: 4.5 mmol/L (ref 3.5–5.1)
Sodium: 139 mmol/L (ref 135–145)
Total Bilirubin: 0.4 mg/dL (ref 0.3–1.2)
Total Protein: 6.2 g/dL — ABNORMAL LOW (ref 6.5–8.1)

## 2019-02-04 LAB — CBC WITH DIFFERENTIAL/PLATELET
Abs Immature Granulocytes: 0.15 10*3/uL — ABNORMAL HIGH (ref 0.00–0.07)
Basophils Absolute: 0 10*3/uL (ref 0.0–0.1)
Basophils Relative: 0 %
Eosinophils Absolute: 0 10*3/uL (ref 0.0–0.5)
Eosinophils Relative: 0 %
HCT: 42.9 % (ref 39.0–52.0)
Hemoglobin: 14.3 g/dL (ref 13.0–17.0)
Immature Granulocytes: 1 %
Lymphocytes Relative: 4 %
Lymphs Abs: 0.5 10*3/uL — ABNORMAL LOW (ref 0.7–4.0)
MCH: 30.7 pg (ref 26.0–34.0)
MCHC: 33.3 g/dL (ref 30.0–36.0)
MCV: 92.1 fL (ref 80.0–100.0)
Monocytes Absolute: 0.6 10*3/uL (ref 0.1–1.0)
Monocytes Relative: 4 %
Neutro Abs: 11.9 10*3/uL — ABNORMAL HIGH (ref 1.7–7.7)
Neutrophils Relative %: 91 %
Platelets: 257 10*3/uL (ref 150–400)
RBC: 4.66 MIL/uL (ref 4.22–5.81)
RDW: 12.5 % (ref 11.5–15.5)
WBC: 13.2 10*3/uL — ABNORMAL HIGH (ref 4.0–10.5)
nRBC: 0 % (ref 0.0–0.2)

## 2019-02-04 LAB — CULTURE, BLOOD (ROUTINE X 2)
Culture: NO GROWTH
Culture: NO GROWTH
Special Requests: ADEQUATE
Special Requests: ADEQUATE

## 2019-02-04 LAB — C-REACTIVE PROTEIN: CRP: <0.8 mg/dL (ref <1.0)

## 2019-02-04 LAB — D-DIMER, QUANTITATIVE: D-Dimer, Quant: 0.41 ug/mL-FEU (ref 0.00–0.50)

## 2019-02-04 LAB — FERRITIN: Ferritin: 669 ng/mL — ABNORMAL HIGH (ref 24–336)

## 2019-02-04 LAB — LACTATE DEHYDROGENASE: LDH: 227 U/L — ABNORMAL HIGH (ref 98–192)

## 2019-02-04 MED ORDER — GUAIFENESIN-DM 100-10 MG/5ML PO SYRP
10.0000 mL | ORAL_SOLUTION | Freq: Four times a day (QID) | ORAL | Status: DC
  Administered 2019-02-04 – 2019-02-05 (×5): 10 mL via ORAL
  Filled 2019-02-04 (×5): qty 10

## 2019-02-04 MED ORDER — METHYLPREDNISOLONE SODIUM SUCC 40 MG IJ SOLR
40.0000 mg | Freq: Two times a day (BID) | INTRAMUSCULAR | Status: DC
  Administered 2019-02-04 – 2019-02-05 (×2): 40 mg via INTRAVENOUS
  Filled 2019-02-04 (×2): qty 1

## 2019-02-04 MED ORDER — FUROSEMIDE 10 MG/ML IJ SOLN
40.0000 mg | Freq: Once | INTRAMUSCULAR | Status: AC
  Administered 2019-02-04: 40 mg via INTRAVENOUS
  Filled 2019-02-04: qty 4

## 2019-02-04 NOTE — Progress Notes (Signed)
SATURATION QUALIFICATIONS:   Patient Saturations on Room Air at Rest =  N/A%  Patient Saturations on Room Air while Ambulating = N/A %  Patient Saturations on 4 Liters of oxygen while Ambulating = 86 %  Please briefly explain why patient needs home oxygen: Patient got out of bed into chair while on 4 liters. Oxygen saturation decreased 86 % on 4 lt. Oxygen increased 5 liters sat increased to 89 %. Saturation improved after rest 92-94% while in chair.

## 2019-02-04 NOTE — Progress Notes (Signed)
[facility]                                                                              Patient Demographics  Robert Davis, is a 43 y.o. male, DOB - 18-Oct-1975, ZOX:[id number]  Admit date - 01/30/2019   Admitting Physician John Giovanni, MD  Outpatient Primary MD for the patient is Lewis Moccasin, MD  Outpatient specialists:   LOS - 5  days   Medical records reviewed and are as summarized below:  Chief complaint; fevers      Brief summary   Patient is a 43 year old male with history of hypertension, hyperlipidemia, COVID-19 viral infection, presented to ED with acute onset, progressively worsening fevers, nausea vomiting and diarrhea.  Patient was seen in ED on 11/8 after attending an orthopedic equipment convention in [place] with shortness of breath cough and fever.  He tested positive at that time but was discharged home as he was found to be stable.  Patient reported that he was discharged from the ED with a steroid.  He initially improved however a night before admission he started having fevers 102 F, nausea and vomiting.  He also had one episode of nonbloody diarrhea.  In ED temp, 103.2 F, tachycardiac, tachypneic, O2 sats 86% on room air, placed on 3 L O2. Inflammatory markers elevated, chest x-ray showed new consolidation in left midlung compatible with active pneumonia hazy density in the right perihilar and infrahilar region could reflect early multilobar pneumonia   Subjective:   Robert Davis was seen and examined today.  Remains on 4-5 L of oxygen, with exertion he continues to desat. Currently on 4 L of oxygen, satting 91% Continues to complain of shortness of breath.  -It was confirmed that he is not O2 dependent at home. Otherwise stable, denies have any chest pain.  Denies any nausea vomiting.  Reporting diarrhea has improved   Assessment & Plan    Principal Problem:   Acute respiratory failure Acute Hypoxic Resp. Failure due to  Acute Covid 19 Viral Pneumonitis during the ongoing 2020 Covid 19 Pandemic - POA  - CT angiogram of the chest showed no pulmonary embolus, diffuse bilateral groundglass opacities in lower lobes -Remains in mild to moderate respiratory distress satting low 90s on 4-5 L of oxygen, currently satting 91% on 4 L of oxygen.  At baseline patient is not O2 dependent. Short of breath at rest of breath and hypoxemia gets worse with exertion  -Remains afebrile, normotensive. -Blood cultures-negative to date  -Continue IV dexamethasone, -Status post 5 days of treatment of IV remdesivir completed on 02/03/2019 -On 02/03/2019 overnight received 1 dose of Actemra, and convalescent plasma on 11/21 - -Monitoring I's and O's, patient has received 1 dose of 40 mg of IV Lasix on 1122 resulting To net  -1870   -We will continue supplemental O2, flutter valve, incentive spirometer, inhalers -We will continue to wean patient off supplemental oxygen   - ambulatory home O2 screening upon discharge. - Has + sick contact, likely at the conference. - Continue inhalers, cough suppressants, vitamins, supportive treatment, incentive spirometry - Continue to follow labs as below  Recent Labs  Lab 01/30/19 1805 01/30/19 2307 01/31/19 0441 02/01/19 0354 02/02/19 0401 02/03/19 0239 02/04/19 0346  DDIMER 0.86* 0.77* 0.62* 0.70* <0.27 0.36 0.41  FERRITIN 1,389* 1,847* 1,845* 1,749* 1,157* 712* 669*  CRP 9.0* 9.3* 10.2* 6.1* 1.7* 0.9 <0.8  ALT 39  --  36 34 30 27 37  PROCALCITON <0.10 0.12  --   --   --   --   --     Active Problems:      Viral gastroenteritis: Likely due to COVID-19 -Diarrhea improving, LFTs normal, no abdominal pain  Mild hypovolemic hyponatremia -Serum sodium improving, 139 today    HLD (hyperlipidemia) Continue Crestor  Code Status: Full CODE STATUS DVT Prophylaxis:  Lovenox  Family Communication:  The patient wife was called and updated.  We recommend continuous monitoring  trying to get his O2 demand to the lowest level possible.  If he remained stable likely will be discharged home in AM.    Disposition Plan:Pending clinical status, currently inpatient given acute hypoxic respiratory failure, acute COVID-19 illness.  Once ambulatory without overt symptoms or hypoxia would consider discharge home, possible dc home in am.  Time Spent in minutes 35 minutes  Procedures:  None  Consultants:   None  Antimicrobials:   Anti-infectives (From admission, onward)   Start     Dose/Rate Route Frequency Ordered Stop   01/31/19 2200  remdesivir 100 mg in sodium chloride 0.9 % 250 mL IVPB     100 mg 500 mL/hr over 30 Minutes Intravenous Every 24 hours 01/30/19 2201 02/03/19 2142   01/30/19 2215  remdesivir 200 mg in sodium chloride 0.9 % 250 mL IVPB     200 mg 500 mL/hr over 30 Minutes Intravenous Once 01/30/19 2201 01/31/19 0100         Medications  Scheduled Meds:  benzonatate  200 mg Oral TID   enoxaparin (LOVENOX) injection  40 mg Subcutaneous Q24H   guaiFENesin-dextromethorphan  10 mL Oral Q6H   Ipratropium-Albuterol  2 puff Inhalation TID   loratadine  10 mg Oral Daily   methylPREDNISolone (SOLU-MEDROL) injection  40 mg Intravenous Q12H   rosuvastatin  5 mg Oral Daily   vitamin C  500 mg Oral Daily   zinc sulfate  220 mg Oral Daily   Continuous Infusions:  PRN Meds:.acetaminophen, albuterol, loperamide, ondansetron (ZOFRAN) IV    Objective:   Vitals:   02/03/19 2133 02/04/19 0500 02/04/19 0510 02/04/19 0834  BP: 122/68  122/87 127/88  Pulse: 66  62 70  Resp: 16  16 18   Temp: 98.5 F (36.9 C)  98.5 F (36.9 C) 98.5 F (36.9 C)  TempSrc: Oral  Oral Oral  SpO2: 96%  93% 91%  Weight:  87.8 kg    Height:        Intake/Output Summary (Last 24 hours) at 02/04/2019 1231 Last data filed at 02/04/2019 0900 Gross per 24 hour  Intake 730 ml  Output 1150 ml  Net -420 ml     Wt Readings from Last 3 Encounters:  02/04/19 87.8  kg  01/20/19 95.3 kg   BP 127/88 (BP Location: Left Arm)    Pulse 70    Temp 98.5 F (36.9 C) (Oral)    Resp 18    Ht 5\' 11"  (1.803 m)    Wt 87.8 kg    SpO2 91%    BMI 27.00 kg/m    Physical Exam  Constitution: Some shortness of breath, worse with exertion alert, cooperative, no distress, continues to be  on 4-5 L of oxygen, satting 91% Psychiatric: Normal and stable mood and affect, cognition intact,   HEENT: Normocephalic, PERRL, otherwise with in Normal limits  Chest:Chest symmetric, positive for rhonchi negative for any crackles or wheezing Cardio vascular:  S1/S2, RRR, No murmure, No Rubs or Gallops  pulmonary: Clear to auscultation bilaterally, respirations unlabored, negative wheezes / crackles Abdomen: Soft, non-tender, non-distended, bowel sounds,no masses, no organomegaly Muscular skeletal: Limited exam - in bed, able to move all 4 extremities, Normal strength,  Neuro: CNII-XII intact. , normal motor and sensation, reflexes intact  Extremities: No pitting edema lower extremities, +2 pulses  Skin: Dry, warm to touch, negative for any Rashes, No open wounds Wounds: per nursing documentation       Data Reviewed:  I have personally reviewed following labs and imaging studies  Micro Results Recent Results (from the past 240 hour(s))  Blood Culture (routine x 2)     Status: None   Collection Time: 01/30/19  6:10 PM   Specimen: BLOOD  Result Value Ref Range Status   Specimen Description   Final    BLOOD RIGHT ANTECUBITAL Performed at [facility], [address] [place], [place] [address]    Special Requests   Final    BOTTLES DRAWN AEROBIC AND ANAEROBIC Blood Culture adequate volume Performed at [facility], [address] [place], [place] [address]    Culture   Final    NO GROWTH 5 DAYS Performed at [facility], [address] [place], [place] [address]    Report Status 02/04/2019 FINAL  Final  Blood Culture (routine x 2)      Status: None   Collection Time: 01/30/19  6:23 PM   Specimen: BLOOD  Result Value Ref Range Status   Specimen Description   Final    BLOOD LEFT ANTECUBITAL Performed at [facility], [address] [place], [place] [address]    Special Requests   Final    BOTTLES DRAWN AEROBIC AND ANAEROBIC Blood Culture adequate volume Performed at [facility], [address] [place], [place] [address]    Culture   Final    NO GROWTH 5 DAYS Performed at [facility], [address] [place], [place] [address]    Report Status 02/04/2019 FINAL  Final  SARS CORONAVIRUS 2 (TAT 6-24 HRS) Nasopharyngeal Nasopharyngeal Swab     Status: Abnormal   Collection Time: 01/31/19  9:24 AM   Specimen: Nasopharyngeal Swab  Result Value Ref Range Status   SARS Coronavirus 2 POSITIVE (A) NEGATIVE Final    Comment: RESULT CALLED TO, READ BACK BY AND VERIFIED WITH: Freida Busman. Griffith RN 15:05 01/31/19 ([email or web address]) (NOTE) SARS-CoV-2 target nucleic acids are DETECTED. The SARS-CoV-2 RNA is generally detectable in upper and lower respiratory specimens during the acute phase of infection. Positive results are indicative of active infection with SARS-CoV-2. Clinical  correlation with patient history and other diagnostic information is necessary to determine patient infection status. Positive results do  not rule out bacterial infection or co-infection with other viruses. The expected result is Negative. Fact Sheet for Patients: HairSlick.nohttps://www.fda.gov/media/138098/download Fact Sheet for Healthcare Providers: quierodirigir.comhttps://www.fda.gov/media/138095/download This test is not yet approved or cleared by the [place] FDA and  has been authorized for detection and/or diagnosis of SARS-CoV-2 by FDA under an Emergency Use Authorization (EUA). This EUA will remain  in effect (meaning this test can be used) f or the duration of the COVID-19 declaration under Section  564(b)(1) of the Act, 21 U.S.C. section  360bbb-3(b)(1), unless the authorization is terminated or revoked sooner. Performed at [facility], [address], [place], [place] [address]     Radiology Reports Ct Angio Chest Pe W Or Wo Contrast  Result Date: 02/01/2019 CLINICAL DATA:  PE suspected, intermediate prob, positive D-dimer. COVID-19 positive EXAM: CT ANGIOGRAPHY CHEST WITH CONTRAST TECHNIQUE: Multidetector CT imaging of the chest was performed using the standard protocol during bolus administration of intravenous contrast. Multiplanar CT image reconstructions and MIPs were obtained to evaluate the vascular anatomy. CONTRAST:  OMNIPAQUE IOHEXOL 350 MG/ML SOLN COMPARISON:  Radiographs 01/30/2019 FINDINGS: Cardiovascular: There are no filling defects within the pulmonary arteries to suggest pulmonary embolus. Subsegmental branches in the mid and lower lung zones partially obscured by breathing motion artifact. Normal caliber thoracic aorta without dissection. Heart is normal in size. No pericardial effusion. Mediastinum/Nodes: Shotty subcarinal nodes remain subcentimeter, likely reactive. No hilar adenopathy. Decompressed esophagus. No visualized thyroid nodule. Lungs/Pleura: Diffuse bilateral geographic ground-glass opacities, most prominent in the lower lobes. No pleural fluid. Trachea and mainstem bronchi are patent. Upper Abdomen: No acute abnormality. Musculoskeletal: There are no acute or suspicious osseous abnormalities. Bone island in the right proximal humerus. Review of the MIP images confirms the above findings. IMPRESSION: 1. No pulmonary embolus, lower lobe evaluation slightly limited by breathing motion artifact. 2. Diffuse bilateral geographic ground-glass opacities, most prominent in the lower lobes consistent with COVID-19 pneumonia. Pulmonary findings have progressed from radiograph 2 days ago. Electronically Signed   By: Narda Rutherford M.D.   On: 02/01/2019 22:32   Dg Chest Port 1 View  Result Date:  02/03/2019 CLINICAL DATA:  Acute respiratory disease. EXAM: PORTABLE CHEST 1 VIEW COMPARISON:  01/30/2019 FINDINGS: 0409 hours. Low volumes. Cardiopericardial silhouette is at upper limits of normal for size. Patchy bilateral airspace disease, left greater than right, similar to prior. No pleural effusion. The visualized bony structures of the thorax are intact. Telemetry leads overlie the chest. IMPRESSION: Stable exam. Patchy bilateral airspace disease, left greater than right. Electronically Signed   By: Kennith Center M.D.   On: 02/03/2019 07:06   Dg Chest Port 1 View  Result Date: 01/30/2019 CLINICAL DATA:  Coughing, nausea, and diarrhea. Patient diagnosed with COVID-19 2 weeks ago. EXAM: PORTABLE CHEST 1 VIEW COMPARISON:  01/20/2019 FINDINGS: New consolidation in the left mid lung compatible with active pneumonia. Hazy density in the right infrahilar and perihilar region could reflect early multilobar involvement. Cardiac and mediastinal margins appear normal. No blunting of the costophrenic angles. IMPRESSION: 1. New consolidation in the left mid lung compatible with active pneumonia. 2. Hazy density in the right perihilar and infrahilar region could reflect early multilobar pneumonia involvement. Electronically Signed   By: Gaylyn Rong M.D.   On: 01/30/2019 20:18   Dg Chest Portable 1 View  Result Date: 01/20/2019 CLINICAL DATA:  Shortness of breath and fever EXAM: PORTABLE CHEST 1 VIEW COMPARISON:  None. FINDINGS: The heart size and mediastinal contours are within normal limits. Both lungs are clear. The visualized skeletal structures are unremarkable. IMPRESSION: No active disease. Electronically Signed   By: Deatra Robinson M.D.   On: 01/20/2019 22:42    Lab Data:  CBC: Recent Labs  Lab 01/31/19 0441 02/01/19 0354 02/02/19 0401 02/03/19 0239 02/04/19 0346  WBC 9.2 11.3* 14.7* 11.1* 13.2*  NEUTROABS 8.1* 9.9* 13.2* 10.0* 11.9*  HGB 14.0 14.3 14.2 14.2 14.3  HCT 42.2 42.4 43.0  43.2 42.9  MCV  91.9 91.8 92.3 92.1 92.1  PLT 161 176 228 242 257   Basic Metabolic Panel: Recent Labs  Lab 01/31/19 0441 02/01/19 0354 02/02/19 0401 02/03/19 0239 02/04/19 0346  NA 132* 133* 137 135 139  K 4.3 3.9 4.4 4.6 4.5  CL 99 101 102 103 106  CO2 GLUCOSE 150* 165* 140* 144* 157*  BUN 22* 27*  CREATININE 0.83 0.78 0.73 0.69 0.73  CALCIUM 8.1* 7.9* 8.5* 8.4* 8.4*   GFR: Estimated Creatinine Clearance: 125.5 mL/min (by C-G formula based on SCr of 0.73 mg/dL). Liver Function Tests: Recent Labs  Lab 01/31/19 0441 02/01/19 0354 02/02/19 0401 02/03/19 0239 02/04/19 0346  AST 35 34 ALT 36 34 30 27 37  ALKPHOS 43 34* 40 38 40  BILITOT 0.6 0.9 0.2* 0.3 0.4  PROT 6.6 6.6 6.6 6.4* 6.2*  ALBUMIN 3.2* 3.1* 3.0* 3.0* 2.9*   Recent Labs    02/03/19 0239 02/04/19 0346  FERRITIN 712* 669*   Urine analysis: No results found for: COLORURINE, APPEARANCEUR, LABSPEC, PHURINE, GLUCOSEU, HGBUR, BILIRUBINUR, KETONESUR, PROTEINUR, UROBILINOGEN, NITRITE, LEUKOCYTESUR   Kendell Bane M.D. [facility] 02/04/2019, 12:31 PM   Call night coverage person covering after 7pm

## 2019-02-04 NOTE — Progress Notes (Signed)
Call placed to [employer] (spouse) [phone]. Voicemail message left.

## 2019-02-04 NOTE — Progress Notes (Signed)
Attempted to wean oxygen to 3 liters, unsuccessful at this time. Saturation on 3 lt sustained 88%. Oxygen increased to 4 liters sat increased 90-92%. Will continue to monitor.

## 2019-02-05 MED ORDER — GUAIFENESIN-DM 100-10 MG/5ML PO SYRP: 10.0000 mL | ORAL_SOLUTION | Freq: Four times a day (QID) | ORAL | 0 refills | Status: AC

## 2019-02-05 MED ORDER — IPRATROPIUM-ALBUTEROL 20-100 MCG/ACT IN AERS: 2.0000 | INHALATION_SPRAY | Freq: Three times a day (TID) | RESPIRATORY_TRACT | 0 refills | Status: AC

## 2019-02-05 MED ORDER — PREDNISONE 10 MG (21) PO TBPK: ORAL_TABLET | ORAL | 0 refills | Status: AC

## 2019-02-05 MED ORDER — BENZONATATE 200 MG PO CAPS: 200.0000 mg | ORAL_CAPSULE | Freq: Three times a day (TID) | ORAL | 0 refills | Status: AC

## 2019-02-05 MED ORDER — ACETAMINOPHEN 325 MG PO TABS: 650.0000 mg | ORAL_TABLET | Freq: Four times a day (QID) | ORAL | 0 refills | Status: AC | PRN

## 2019-02-05 NOTE — Discharge Summary (Signed)
Physician Discharge Summary [facility]    Patient: Robert Davis                   Admit date: 01/30/2019   DOB: [birth year]             Discharge date:02/05/2019/10:28 AM ZOX:[id number]                          PCP: Lewis Moccasin, MD  Disposition: Home  Recommendations for Outpatient Follow-up:    Follow up: in 2 week  Discharge Condition: Stable   Code Status:   Code Status: Full Code  Diet recommendation: Regular healthy diet   Discharge Diagnoses:    Principal Problem:   Pneumonia due to COVID-19 virus Active Problems:   Acute respiratory failure with hypoxia (HCC)   Viral gastroenteritis   Hyponatremia   HLD (hyperlipidemia)   History of Present Illness/ Hospital Course [name] Summary:    Patient is a 43 year old male with history of hypertension, hyperlipidemia, COVID-19 viral infection, presented to ED with acute onset, progressively worsening fevers, nausea vomiting and diarrhea.  Patient was seen in ED on 11/8 after attending an orthopedic equipment convention in [place] with shortness of breath cough and fever.  He tested positive at that time but was discharged home as he was found to be stable.  Patient reported that he was discharged from the ED with a steroid.  He initially improved however a night before admission he started having fevers 102 F, nausea and vomiting.  He also had one episode of nonbloody diarrhea.  In ED Temp, 103.2 F, tachycardiac, tachypneic, O2 sats 86% on room air, placed on 3 L O2. Inflammatory markers elevated, chest x-ray showed new consolidation in left midlung compatible with active pneumonia hazy density in the right perihilar and infrahilar region could reflect early multilobar pneumonia  He responded that well to Covid treatment regimen which included (available, Actemra, IV steroids, inhalers.  Was on O2 supplement as high as 5 L, slowly taper down to room air. If he maintains his O2 sat greater than 90% on  room air he will be discharged home today.  Subjective:   Robert Davis  was seen and examined, patient was satting 96% on 2 L of oxygen, temporarily oxygen is taken off see if he maintains his O2 sat greater than 92% if so he will be discharged home. He has no complaints remained stable requesting if he could be discharged home Assessment & Plan    Principal Problem:   Acute respiratory failure Acute Hypoxic Resp. Failure due to Acute Covid 19 Viral Pneumonitis during the ongoing 2020 Covid 19 Pandemic - POA  - CT angiogram of the chest showed no pulmonary embolus, diffuse bilateral groundglass opacities in lower lobes -Still requiring approximately 2 L of oxygen to maintain his O2 sat greater than 92%, will taper down to room air if he remained stable on room air >92%, then we will discharge him home    At baseline patient is not O2 dependent.   -Remains afebrile, normotensive. -Blood cultures-negative to date  -Status post treatment with IV dexamethasone, will place him back on tapered steroids -Status post 5 days of treatment of IV remdesivir completed on 02/03/2019 -On 02/03/2019 overnight received 1 dose of Actemra, and convalescent plasma on 11/21 -  -We will continue, flutter valve, incentive spirometer, inhalers, tapering off oxygen supplement    - ambulatory home O2 screening upon discharge. -  Has + sick contact, likely at the conference. - Continue inhalers, cough suppressants, vitamins, supportive treatment, incentive spirometry - Continue to follow labs as below  Last Labs            Recent Labs  Lab 01/30/19 1805 01/30/19 2307 01/31/19 0441 02/01/19 0354 02/02/19 0401 02/03/19 0239 02/04/19 0346  DDIMER 0.86* 0.77* 0.62* 0.70* <0.27 0.36 0.41  FERRITIN 1,389* 1,847* 1,845* 1,749* 1,157* 712* 669*  CRP 9.0* 9.3* 10.2* 6.1* 1.7* 0.9 <0.8  ALT 39  --  36 34 30 27 37  PROCALCITON <0.10 0.12  --   --   --   --   --       Active Problems:       Viral gastroenteritis: Likely due to COVID-19 -Diarrhea improving, LFTs normal, no abdominal pain  Mild hypovolemic hyponatremia -Serum sodium improving,     HLD (hyperlipidemia) Continue Crestor  Code Status: Full CODE STATUS Family Communication:  The patient wife was called and updated.    Disposition Plan: Home if he maintains his O2 sat greater than 92% on room air       Discharge Instructions:   Discharge Instructions    Activity as tolerated - No restrictions   Complete by: As directed    Call MD for:  difficulty breathing, headache or visual disturbances   Complete by: As directed    Diet - low sodium heart healthy   Complete by: As directed    Discharge instructions   Complete by: As directed    Continue prednisone taper, your inhalers every 4 hours,   Increase activity slowly   Complete by: As directed        Medication List    STOP taking these medications   amoxicillin-clavulanate 875-125 MG tablet Commonly known as: AUGMENTIN   ibuprofen 600 MG tablet Commonly known as: ADVIL   losartan 25 MG tablet Commonly known as: COZAAR     TAKE these medications   acetaminophen 325 MG tablet Commonly known as: TYLENOL Take 2 tablets (650 mg total) by mouth every 6 (six) hours as needed for mild pain or headache (fever >/= 101).   benzonatate 200 MG capsule Commonly known as: TESSALON Take 1 capsule (200 mg total) by mouth 3 (three) times daily.   guaiFENesin-dextromethorphan 100-10 MG/5ML syrup Commonly known as: ROBITUSSIN DM Take 10 mLs by mouth every 6 (six) hours.   Ipratropium-Albuterol 20-100 MCG/ACT Aers respimat Commonly known as: COMBIVENT Inhale 2 puffs into the lungs 3 (three) times daily.   loratadine 10 MG tablet Commonly known as: CLARITIN Take 10 mg by mouth daily.   predniSONE 10 MG (21) Tbpk tablet Commonly known as: STERAPRED UNI-PAK 21 TAB Take 6 tabs for 2 days, then 5 for 2 days, then 4 for 2 days, then 3 for 2 days,  2 for 2 days, then 1 for 2 days   rosuvastatin 5 MG tablet Commonly known as: CRESTOR Take 5 mg by mouth daily.       Allergies  Allergen Reactions   Codeine Nausea Only and Hypertension     Procedures /Studies:   Ct Angio Chest Pe W Or Wo Contrast  Result Date: 02/01/2019 CLINICAL DATA:  PE suspected, intermediate prob, positive D-dimer. COVID-19 positive EXAM: CT ANGIOGRAPHY CHEST WITH CONTRAST TECHNIQUE: Multidetector CT imaging of the chest was performed using the standard protocol during bolus administration of intravenous contrast. Multiplanar CT image reconstructions and MIPs were obtained to evaluate the vascular anatomy. CONTRAST:  100mL OMNIPAQUE IOHEXOL  350 MG/ML SOLN COMPARISON:  Radiographs 01/30/2019 FINDINGS: Cardiovascular: There are no filling defects within the pulmonary arteries to suggest pulmonary embolus. Subsegmental branches in the mid and lower lung zones partially obscured by breathing motion artifact. Normal caliber thoracic aorta without dissection. Heart is normal in size. No pericardial effusion. Mediastinum/Nodes: Shotty subcarinal nodes remain subcentimeter, likely reactive. No hilar adenopathy. Decompressed esophagus. No visualized thyroid nodule. Lungs/Pleura: Diffuse bilateral geographic ground-glass opacities, most prominent in the lower lobes. No pleural fluid. Trachea and mainstem bronchi are patent. Upper Abdomen: No acute abnormality. Musculoskeletal: There are no acute or suspicious osseous abnormalities. Bone island in the right proximal humerus. Review of the MIP images confirms the above findings. IMPRESSION: 1. No pulmonary embolus, lower lobe evaluation slightly limited by breathing motion artifact. 2. Diffuse bilateral geographic ground-glass opacities, most prominent in the lower lobes consistent with COVID-19 pneumonia. Pulmonary findings have progressed from radiograph 2 days ago. Electronically Signed   By: Keith Rake M.D.   On: 02/01/2019  22:32   Dg Chest Port 1 View  Result Date: 02/03/2019 CLINICAL DATA:  Acute respiratory disease. EXAM: PORTABLE CHEST 1 VIEW COMPARISON:  01/30/2019 FINDINGS: 0409 hours. Low volumes. Cardiopericardial silhouette is at upper limits of normal for size. Patchy bilateral airspace disease, left greater than right, similar to prior. No pleural effusion. The visualized bony structures of the thorax are intact. Telemetry leads overlie the chest. IMPRESSION: Stable exam. Patchy bilateral airspace disease, left greater than right. Electronically Signed   By: Misty Stanley M.D.   On: 02/03/2019 07:06   Dg Chest Port 1 View  Result Date: 01/30/2019 CLINICAL DATA:  Coughing, nausea, and diarrhea. Patient diagnosed with COVID-19 2 weeks ago. EXAM: PORTABLE CHEST 1 VIEW COMPARISON:  01/20/2019 FINDINGS: New consolidation in the left mid lung compatible with active pneumonia. Hazy density in the right infrahilar and perihilar region could reflect early multilobar involvement. Cardiac and mediastinal margins appear normal. No blunting of the costophrenic angles. IMPRESSION: 1. New consolidation in the left mid lung compatible with active pneumonia. 2. Hazy density in the right perihilar and infrahilar region could reflect early multilobar pneumonia involvement. Electronically Signed   By: Van Clines M.D.   On: 01/30/2019 20:18   Dg Chest Portable 1 View  Result Date: 01/20/2019 CLINICAL DATA:  Shortness of breath and fever EXAM: PORTABLE CHEST 1 VIEW COMPARISON:  None. FINDINGS: The heart size and mediastinal contours are within normal limits. Both lungs are clear. The visualized skeletal structures are unremarkable. IMPRESSION: No active disease. Electronically Signed   By: Ulyses Jarred M.D.   On: 01/20/2019 22:42     Subjective:   Patient was seen and examined 02/05/2019, 10:28 AM Patient stable today. No acute distress.  No issues overnight Stable for discharge.  Discharge Exam:    Vitals:    02/04/19 2104 02/05/19 0500 02/05/19 0520 02/05/19 0826  BP: 134/84  130/88 (!) 141/94  Pulse: (!) 58  (!) 50 81  Resp: 20  18 16   Temp: 98.2 F (36.8 C)  98.2 F (36.8 C) 98 F (36.7 C)  TempSrc: Oral  Oral Oral  SpO2: 93%  96% 95%  Weight:  87.2 kg    Height:        General: Pt lying comfortably in bed & appears in no obvious distress. Cardiovascular: S1 & S2 heard, RRR, S1/S2 +. No murmurs, rubs, gallops or clicks. No JVD or pedal edema. Respiratory: Clear to auscultation without wheezing, rhonchi or crackles. No increased work of  breathing. Abdominal:  Non-distended, non-tender & soft. No organomegaly or masses appreciated. Normal bowel sounds heard. CNS: Alert and oriented. No focal deficits. Extremities: no edema, no cyanosis    The results of significant diagnostics from this hospitalization (including imaging, microbiology, ancillary and laboratory) are listed below for reference.      Microbiology:   Recent Results (from the past 240 hour(s))  Blood Culture (routine x 2)     Status: None   Collection Time: 01/30/19  6:10 PM   Specimen: BLOOD  Result Value Ref Range Status   Specimen Description   Final    BLOOD RIGHT ANTECUBITAL Performed at [facility], [address] [place], [place] [address]    Special Requests   Final    BOTTLES DRAWN AEROBIC AND ANAEROBIC Blood Culture adequate volume Performed at [facility], [address], [place], [place] [address]    Culture   Final    NO GROWTH 5 DAYS Performed at [facility], [address], [place], [place] [address]    Report Status 02/04/2019 FINAL  Final  Blood Culture (routine x 2)     Status: None   Collection Time: 01/30/19  6:23 PM   Specimen: BLOOD  Result Value Ref Range Status   Specimen Description   Final    BLOOD LEFT ANTECUBITAL Performed at [facility], [address], [place], [place] [address]    Special Requests   Final    BOTTLES DRAWN AEROBIC  AND ANAEROBIC Blood Culture adequate volume Performed at [facility], [address] [place], [place] [address]    Culture   Final    NO GROWTH 5 DAYS Performed at [facility], [address], [place], [place] [address]    Report Status 02/04/2019 FINAL  Final  SARS CORONAVIRUS 2 (TAT 6-24 HRS) Nasopharyngeal Nasopharyngeal Swab     Status: Abnormal   Collection Time: 01/31/19  9:24 AM   Specimen: Nasopharyngeal Swab  Result Value Ref Range Status   SARS Coronavirus 2 POSITIVE (A) NEGATIVE Final    Comment: RESULT CALLED TO, READ BACK BY AND VERIFIED WITH: Freida Busman RN 15:05 01/31/19 ([email or web address]) (NOTE) SARS-CoV-2 target nucleic acids are DETECTED. The SARS-CoV-2 RNA is generally detectable in upper and lower respiratory specimens during the acute phase of infection. Positive results are indicative of active infection with SARS-CoV-2. Clinical  correlation with patient history and other diagnostic information is necessary to determine patient infection status. Positive results do  not rule out bacterial infection or co-infection with other viruses. The expected result is Negative. Fact Sheet for Patients: HairSlick.no Fact Sheet for Healthcare Providers: quierodirigir.com This test is not yet approved or cleared by the [place] FDA and  has been authorized for detection and/or diagnosis of SARS-CoV-2 by FDA under an Emergency Use Authorization (EUA). This EUA will remain  in effect (meaning this test can be used) f or the duration of the COVID-19 declaration under Section 564(b)(1) of the Act, 21 U.S.C. section 360bbb-3(b)(1), unless the authorization is terminated or revoked sooner. Performed at [facility], [address] [place], [place] [address]      Labs:   CBC: Recent Labs  Lab 01/31/19 0441 02/01/19 0354 02/02/19 0401 02/03/19 0239 02/04/19 0346  WBC 9.2 11.3* 14.7*  11.1* 13.2*  NEUTROABS 8.1* 9.9* 13.2* 10.0* 11.9*  HGB 14.0 14.3 14.2 14.2 14.3  HCT 42.2 42.4 43.0 43.2 42.9  MCV 91.9 91.8  92.3 92.1 92.1  PLT 161 176 228 242 257   Basic Metabolic Panel: Recent Labs  Lab 01/31/19 0441 02/01/19 0354 02/02/19 0401 02/03/19 0239 02/04/19 0346  NA 132* 133* 137 135 139  K 4.3 3.9 4.4 4.6 4.5  CL 99 101 102 103 106  CO2 23 22 24 23 25   GLUCOSE 150* 165* 140* 144* 157*  BUN 15 19 19  22* 27*  CREATININE 0.83 0.78 0.73 0.69 0.73  CALCIUM 8.1* 7.9* 8.5* 8.4* 8.4*   Liver Function Tests: Recent Labs  Lab 01/31/19 0441 02/01/19 0354 02/02/19 0401 02/03/19 0239 02/04/19 0346  AST 35 34 31 25 29   ALT 36 34 30 27 37  ALKPHOS 43 34* 40 38 40  BILITOT 0.6 0.9 0.2* 0.3 0.4  PROT 6.6 6.6 6.6 6.4* 6.2*  ALBUMIN 3.2* 3.1* 3.0* 3.0* 2.9*   BNP (last 3 results) Recent Labs    02/01/19 1432 02/03/19 0239  BNP 23.2 43.8  Anemia work up Recent Labs    02/03/19 0239 02/04/19 0346  FERRITIN 712* 669*   Urinalysis No results found for: COLORURINE, APPEARANCEUR, LABSPEC, PHURINE, GLUCOSEU, HGBUR, BILIRUBINUR, KETONESUR, PROTEINUR, UROBILINOGEN, NITRITE, LEUKOCYTESUR  Time coordinating discharge: Over 40 minutes  SIGNED: 02/05/19, MD, FACP, FHM. [facility],  Pager [phone]  If 7PM-7AM, please contact night-coverage [email or web address] 02/05/2019, 10:28 AM

## 2019-02-05 NOTE — Progress Notes (Signed)
This am patient was weaned to room air. Oxygen saturation has sustained 92-94% on room air. Additional education provided regarding the importance flow meter, incentive spirometer use. Pt will continue prone positioning.  Pt has been encouraged to continue precautions focusing on mask use at all times and hand hygiene. Discharge instructions reviewed, questions concerns denied. No change from am assessment. Pt is out of bed self remains A&Ox4.

## 2019-02-11 DIAGNOSIS — R509 Fever, unspecified: Secondary | ICD-10-CM | POA: Diagnosis not present

## 2019-02-11 DIAGNOSIS — J189 Pneumonia, unspecified organism: Secondary | ICD-10-CM | POA: Diagnosis not present

## 2019-02-11 DIAGNOSIS — U071 COVID-19: Secondary | ICD-10-CM | POA: Diagnosis not present

## 2019-02-11 DIAGNOSIS — E860 Dehydration: Secondary | ICD-10-CM | POA: Diagnosis not present

## 2019-02-15 DIAGNOSIS — J189 Pneumonia, unspecified organism: Secondary | ICD-10-CM | POA: Diagnosis not present

## 2019-02-15 DIAGNOSIS — G4730 Sleep apnea, unspecified: Secondary | ICD-10-CM | POA: Diagnosis not present

## 2019-02-15 DIAGNOSIS — U071 COVID-19: Secondary | ICD-10-CM | POA: Diagnosis not present

## 2019-02-15 DIAGNOSIS — I10 Essential (primary) hypertension: Secondary | ICD-10-CM | POA: Diagnosis not present

## 2019-03-26 DIAGNOSIS — I10 Essential (primary) hypertension: Secondary | ICD-10-CM | POA: Diagnosis not present

## 2019-03-26 DIAGNOSIS — R5383 Other fatigue: Secondary | ICD-10-CM | POA: Diagnosis not present

## 2019-03-26 DIAGNOSIS — U071 COVID-19: Secondary | ICD-10-CM | POA: Diagnosis not present

## 2019-04-01 DIAGNOSIS — I10 Essential (primary) hypertension: Secondary | ICD-10-CM | POA: Diagnosis not present

## 2019-04-01 DIAGNOSIS — R945 Abnormal results of liver function studies: Secondary | ICD-10-CM | POA: Diagnosis not present

## 2019-04-01 DIAGNOSIS — E785 Hyperlipidemia, unspecified: Secondary | ICD-10-CM | POA: Diagnosis not present

## 2019-04-08 DIAGNOSIS — G4730 Sleep apnea, unspecified: Secondary | ICD-10-CM | POA: Diagnosis not present

## 2019-04-08 DIAGNOSIS — I10 Essential (primary) hypertension: Secondary | ICD-10-CM | POA: Diagnosis not present

## 2019-04-08 DIAGNOSIS — E785 Hyperlipidemia, unspecified: Secondary | ICD-10-CM | POA: Diagnosis not present

## 2019-04-08 DIAGNOSIS — Z6828 Body mass index (BMI) 28.0-28.9, adult: Secondary | ICD-10-CM | POA: Diagnosis not present

## 2019-04-08 DIAGNOSIS — J309 Allergic rhinitis, unspecified: Secondary | ICD-10-CM | POA: Diagnosis not present

## 2019-04-08 DIAGNOSIS — E782 Mixed hyperlipidemia: Secondary | ICD-10-CM | POA: Diagnosis not present

## 2019-04-22 DIAGNOSIS — Z8616 Personal history of COVID-19: Secondary | ICD-10-CM | POA: Diagnosis not present

## 2019-04-22 DIAGNOSIS — Z111 Encounter for screening for respiratory tuberculosis: Secondary | ICD-10-CM | POA: Diagnosis not present

## 2019-05-16 DIAGNOSIS — G4733 Obstructive sleep apnea (adult) (pediatric): Secondary | ICD-10-CM | POA: Diagnosis not present

## 2019-08-23 DIAGNOSIS — E785 Hyperlipidemia, unspecified: Secondary | ICD-10-CM | POA: Diagnosis not present

## 2019-08-23 DIAGNOSIS — I10 Essential (primary) hypertension: Secondary | ICD-10-CM | POA: Diagnosis not present

## 2019-08-23 DIAGNOSIS — M25512 Pain in left shoulder: Secondary | ICD-10-CM | POA: Diagnosis not present

## 2019-09-27 DIAGNOSIS — H6691 Otitis media, unspecified, right ear: Secondary | ICD-10-CM | POA: Diagnosis not present

## 2019-09-27 DIAGNOSIS — B349 Viral infection, unspecified: Secondary | ICD-10-CM | POA: Diagnosis not present

## 2019-10-08 DIAGNOSIS — Z2821 Immunization not carried out because of patient refusal: Secondary | ICD-10-CM | POA: Diagnosis not present

## 2019-10-08 DIAGNOSIS — Z87898 Personal history of other specified conditions: Secondary | ICD-10-CM | POA: Diagnosis not present

## 2019-10-18 DIAGNOSIS — Z20822 Contact with and (suspected) exposure to covid-19: Secondary | ICD-10-CM | POA: Diagnosis not present

## 2019-10-21 DIAGNOSIS — Z20822 Contact with and (suspected) exposure to covid-19: Secondary | ICD-10-CM | POA: Diagnosis not present

## 2021-04-06 IMAGING — DX DG CHEST 1V PORT
1 series · 1 of 1 positions shown · non-contrast
Comparison: None.

CLINICAL DATA: Shortness of breath and fever

EXAM:
PORTABLE CHEST 1 VIEW

[chest ap]
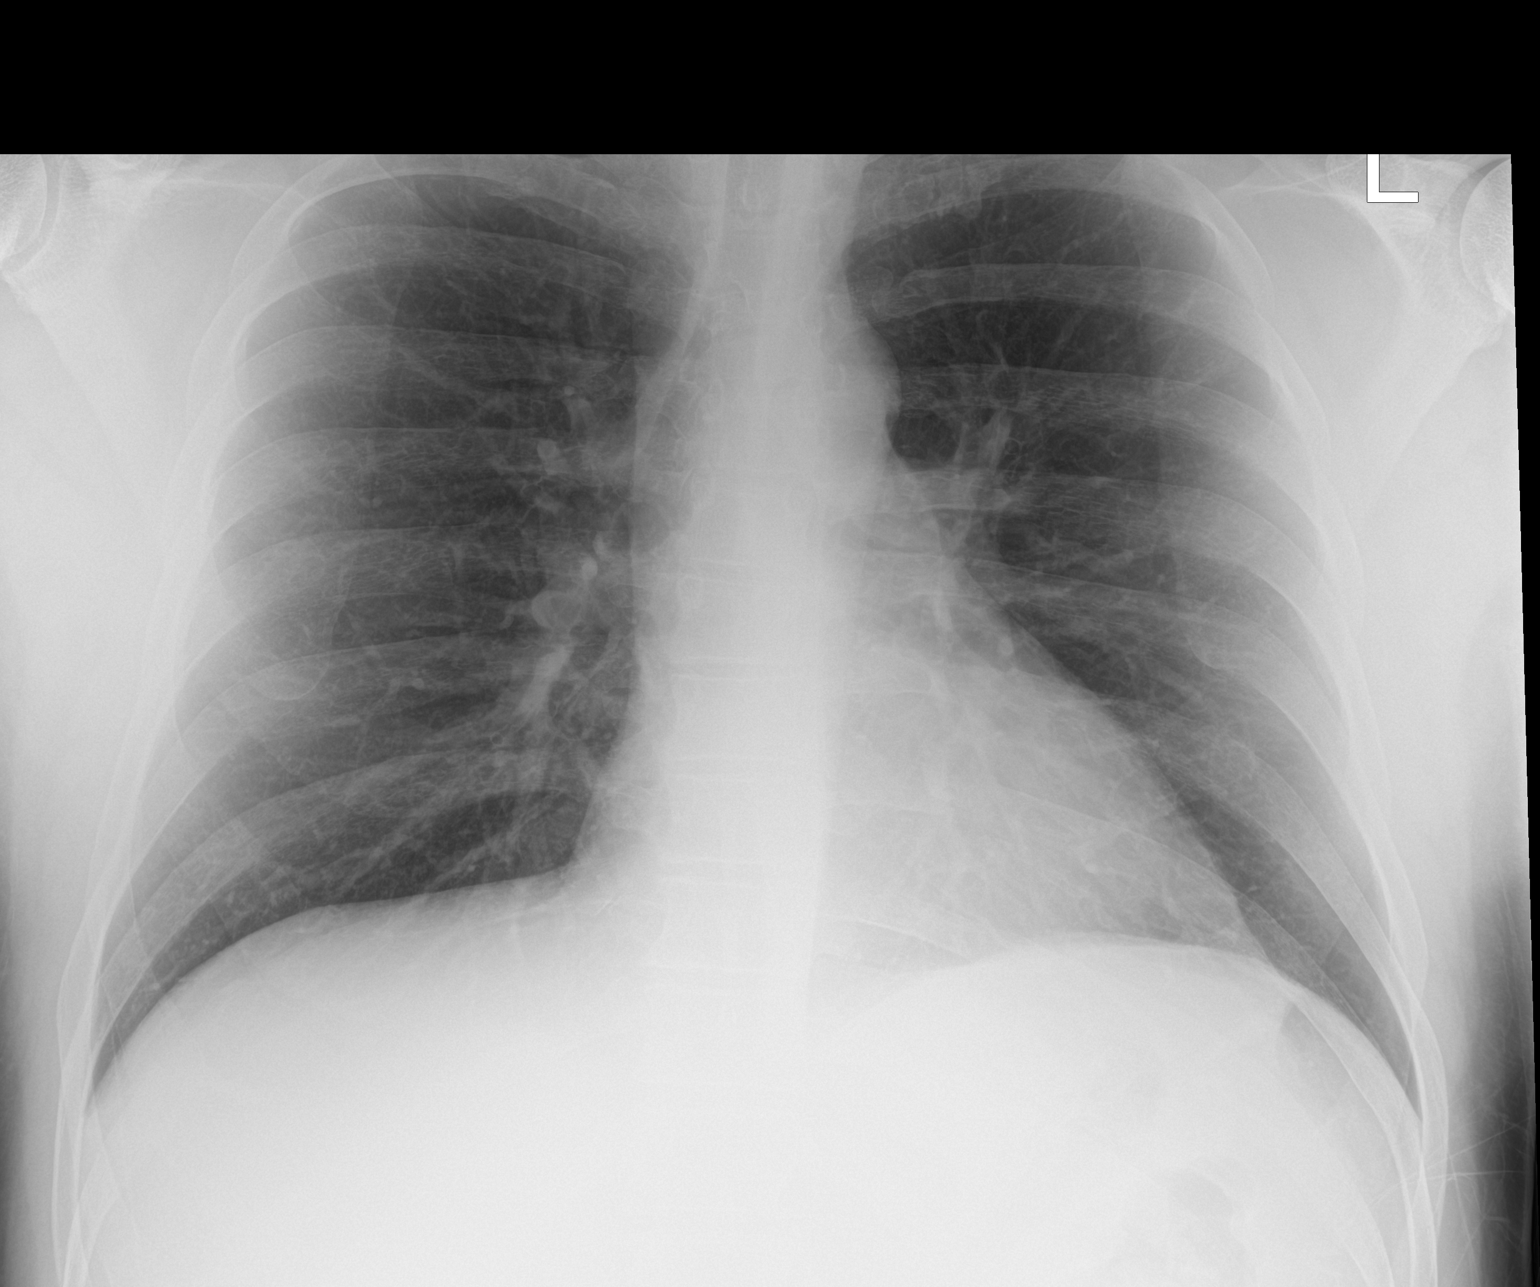

[1 of 1 positions shown; findings below may reference images not displayed]

FINDINGS: The heart size and mediastinal contours are within normal limits.
Both lungs are clear. The visualized skeletal structures are
unremarkable.
IMPRESSION: No active disease.

## 2021-04-20 IMAGING — DX DG CHEST 1V PORT
1 series · 1 of 1 positions shown · non-contrast
Comparison: 01/30/2019

CLINICAL DATA: Acute respiratory disease.

EXAM:
PORTABLE CHEST 1 VIEW

[chest ap]
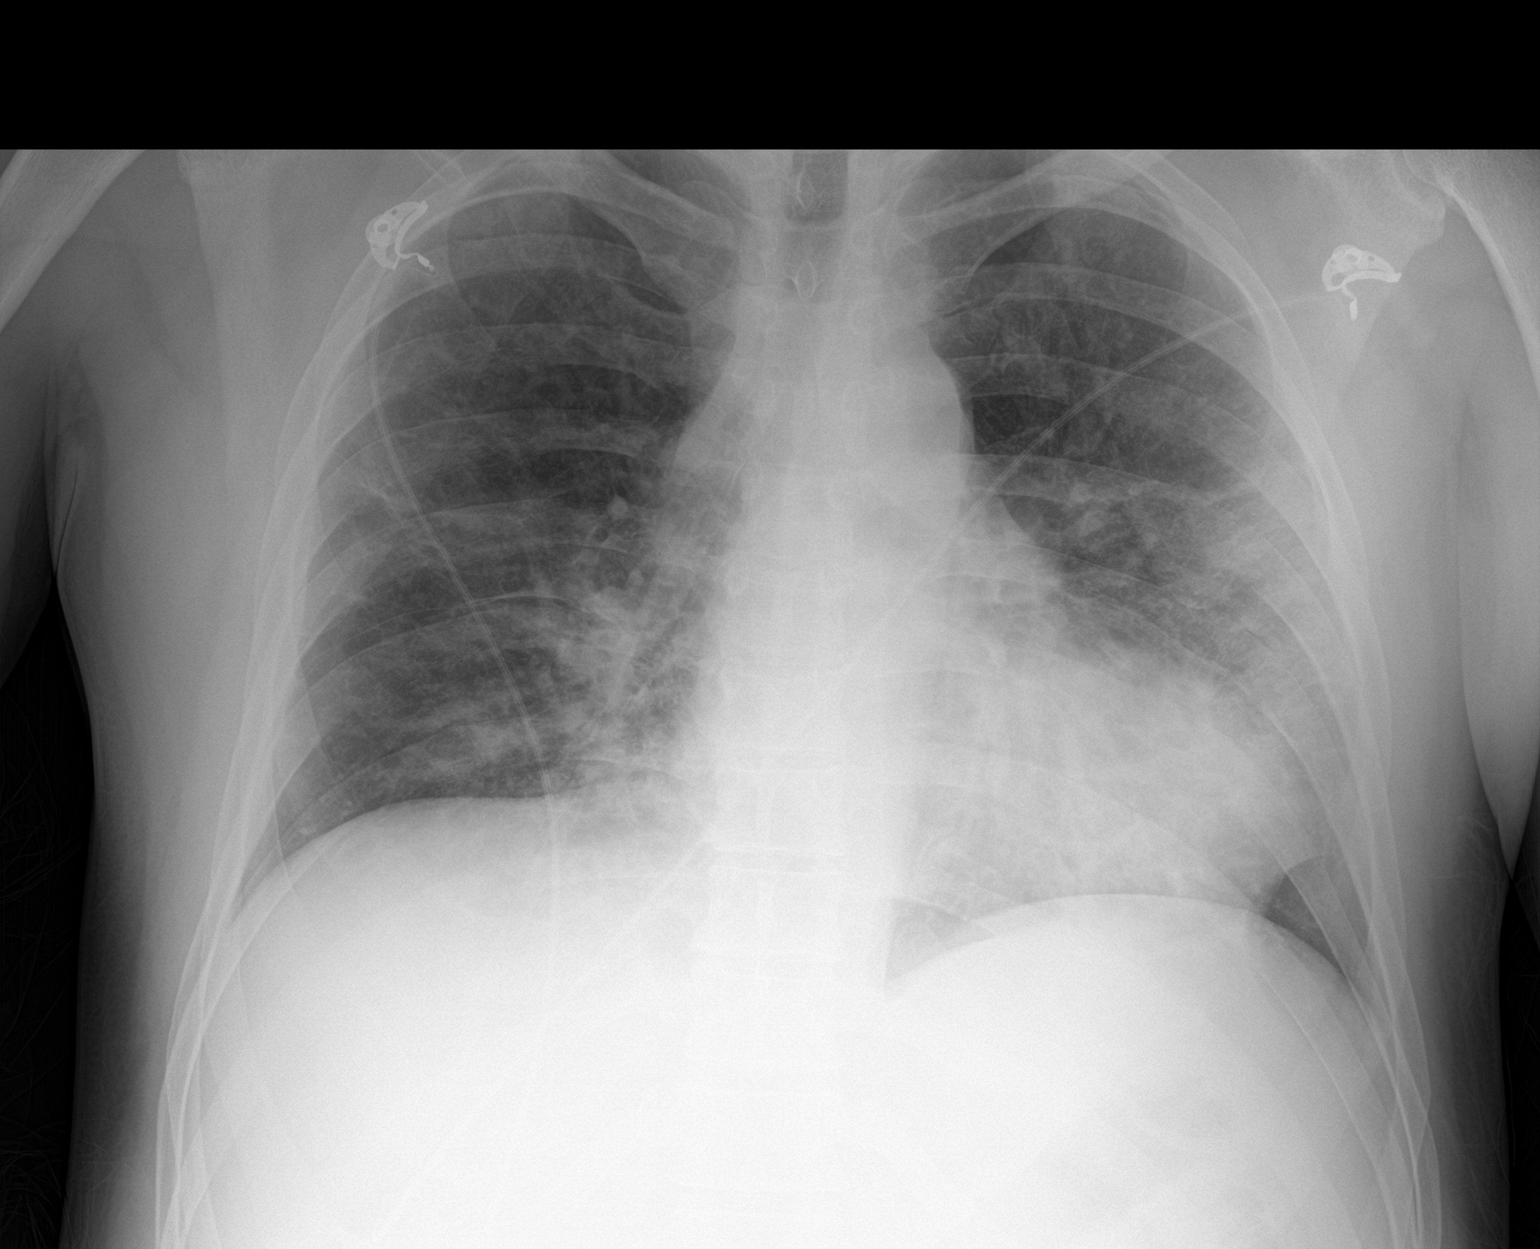

[1 of 1 positions shown; findings below may reference images not displayed]

FINDINGS: 7072 hours. Low volumes. Cardiopericardial silhouette is at upper
limits of normal for size. Patchy bilateral airspace disease, left
greater than right, similar to prior. No pleural effusion. The
visualized bony structures of the thorax are intact. Telemetry leads
overlie the chest.
IMPRESSION: Stable exam. Patchy bilateral airspace disease, left greater than
right.
